# Patient Record
Sex: Female | Born: 1990 | Race: Black or African American | Hispanic: No | Marital: Single | Smoking: Never smoker
Health system: Southern US, Community
[De-identification: ages and names within clinical notes are randomized; demographics above are authoritative.]

## PROBLEM LIST (undated history)

## (undated) ENCOUNTER — Inpatient Hospital Stay (HOSPITAL_COMMUNITY): Payer: Self-pay

## (undated) DIAGNOSIS — F329 Major depressive disorder, single episode, unspecified: Secondary | ICD-10-CM

## (undated) DIAGNOSIS — Z9151 Personal history of suicidal behavior: Secondary | ICD-10-CM

## (undated) DIAGNOSIS — F32A Depression, unspecified: Secondary | ICD-10-CM

## (undated) DIAGNOSIS — R51 Headache: Secondary | ICD-10-CM

## (undated) DIAGNOSIS — Z915 Personal history of self-harm: Secondary | ICD-10-CM

## (undated) DIAGNOSIS — R519 Headache, unspecified: Secondary | ICD-10-CM

## (undated) HISTORY — PX: WISDOM TOOTH EXTRACTION: SHX21

---
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
## 2012-07-23 HISTORY — PX: LAPAROSCOPIC ABDOMINAL EXPLORATION: SHX6249

## 2014-06-22 ENCOUNTER — Inpatient Hospital Stay (HOSPITAL_COMMUNITY)
Admission: AD | Admit: 2014-06-22 | Discharge: 2014-06-22 | Disposition: A | Discharge status: Home / Self Care | Payer: Self-pay | Source: Ambulatory Visit | Attending: Obstetrics and Gynecology | Admitting: Obstetrics and Gynecology

## 2014-06-22 ENCOUNTER — Encounter (HOSPITAL_COMMUNITY): Payer: Self-pay | Admitting: *Deleted

## 2014-06-22 ENCOUNTER — Inpatient Hospital Stay (HOSPITAL_COMMUNITY): Payer: Self-pay

## 2014-06-22 DIAGNOSIS — N831 Corpus luteum cyst of ovary, unspecified side: Secondary | ICD-10-CM

## 2014-06-22 DIAGNOSIS — O219 Vomiting of pregnancy, unspecified: Secondary | ICD-10-CM

## 2014-06-22 DIAGNOSIS — O3441 Maternal care for other abnormalities of cervix, first trimester: Secondary | ICD-10-CM | POA: Insufficient documentation

## 2014-06-22 DIAGNOSIS — O210 Mild hyperemesis gravidarum: Secondary | ICD-10-CM | POA: Insufficient documentation

## 2014-06-22 DIAGNOSIS — O3481 Maternal care for other abnormalities of pelvic organs, first trimester: Secondary | ICD-10-CM | POA: Insufficient documentation

## 2014-06-22 DIAGNOSIS — Z3A01 Less than 8 weeks gestation of pregnancy: Secondary | ICD-10-CM | POA: Insufficient documentation

## 2014-06-22 DIAGNOSIS — N812 Incomplete uterovaginal prolapse: Secondary | ICD-10-CM | POA: Insufficient documentation

## 2014-06-22 DIAGNOSIS — R109 Unspecified abdominal pain: Secondary | ICD-10-CM | POA: Insufficient documentation

## 2014-06-22 DIAGNOSIS — O9989 Other specified diseases and conditions complicating pregnancy, childbirth and the puerperium: Secondary | ICD-10-CM

## 2014-06-22 DIAGNOSIS — O26899 Other specified pregnancy related conditions, unspecified trimester: Secondary | ICD-10-CM

## 2014-06-22 HISTORY — DX: Headache, unspecified: R51.9

## 2014-06-22 HISTORY — DX: Headache: R51

## 2014-06-22 LAB — URINALYSIS, ROUTINE W REFLEX MICROSCOPIC
BILIRUBIN URINE: NEGATIVE
Glucose, UA: NEGATIVE mg/dL
HGB URINE DIPSTICK: NEGATIVE
Ketones, ur: NEGATIVE mg/dL
Nitrite: NEGATIVE
PROTEIN: NEGATIVE mg/dL
Specific Gravity, Urine: 1.02 (ref 1.005–1.030)
UROBILINOGEN UA: 0.2 mg/dL (ref 0.0–1.0)
pH: 7 (ref 5.0–8.0)

## 2014-06-22 LAB — CBC
HCT: 36.4 % (ref 36.0–46.0)
Hemoglobin: 12.5 g/dL (ref 12.0–15.0)
MCH: 29.9 pg (ref 26.0–34.0)
MCHC: 34.3 g/dL (ref 30.0–36.0)
MCV: 87.1 fL (ref 78.0–100.0)
PLATELETS: 241 10*3/uL (ref 150–400)
RBC: 4.18 MIL/uL (ref 3.87–5.11)
RDW: 13 % (ref 11.5–15.5)
WBC: 4.6 10*3/uL (ref 4.0–10.5)

## 2014-06-22 LAB — HIV ANTIBODY (ROUTINE TESTING W REFLEX): HIV: NONREACTIVE

## 2014-06-22 LAB — POCT PREGNANCY, URINE: Preg Test, Ur: POSITIVE — AB

## 2014-06-22 LAB — ABO/RH: ABO/RH(D): B POS

## 2014-06-22 LAB — WET PREP, GENITAL
Clue Cells Wet Prep HPF POC: NONE SEEN
Trich, Wet Prep: NONE SEEN
YEAST WET PREP: NONE SEEN

## 2014-06-22 LAB — URINE MICROSCOPIC-ADD ON

## 2014-06-22 LAB — HCG, QUANTITATIVE, PREGNANCY: HCG, BETA CHAIN, QUANT, S: 51741 m[IU]/mL — AB (ref <5)

## 2014-06-22 MED ORDER — PROMETHAZINE HCL 25 MG PO TABS
25.0000 mg | ORAL_TABLET | Freq: Once | ORAL | Status: AC
  Administered 2014-06-22: 25 mg via ORAL
  Filled 2014-06-22: qty 1

## 2014-06-22 MED ORDER — PROMETHAZINE HCL 25 MG PO TABS: 25.0000 mg | ORAL_TABLET | Freq: Four times a day (QID) | ORAL | Status: DC | PRN

## 2014-06-22 MED ORDER — METOCLOPRAMIDE HCL 10 MG PO TABS: 20.0000 mg | ORAL_TABLET | Freq: Four times a day (QID) | ORAL | Status: DC | PRN

## 2014-06-22 NOTE — MAU Note (Signed)
Patient states she has had a positive home pregnancy test. 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 States she has nausea all the time with occasional vomiting. Has pain in the left lower abdomen. Denies bleeding or vaginal discharge.

## 2014-06-22 NOTE — MAU Note (Signed)
Lab unable to get labs and sending another tec over at this time.

## 2014-06-22 NOTE — MAU Note (Signed)
Pt. [place] in [place] about a fibroid about 1.2877yrs ago and has not followed up since. Here due to positive home pregnancy test. Also, states she has occasional nausea and vomiting. Fibroid is on the left side but has sharp pains bilaterally in the lower portion of her abdomen. LMP 05/01/14.

## 2014-06-22 NOTE — Discharge Instructions (Signed)
°  Abdominal Pain During Pregnancy Belly (abdominal) pain is common during pregnancy. Most of the time, it is not a serious problem. Other times, it can be a sign that something is wrong with the pregnancy. Always tell your doctor if you have belly pain. HOME CARE Monitor your belly pain for any changes. The following actions may help you feel better:  Do not have sex (intercourse) or put anything in your vagina until you feel better.  Rest until your pain stops.  Drink clear fluids if you feel sick to your stomach (nauseous). Do not eat solid food until you feel better.  Only take medicine as told by your doctor.  Keep all doctor visits as told. GET HELP RIGHT AWAY IF:   You are bleeding, leaking fluid, or pieces of tissue come out of your vagina.  You have more pain or cramping.  You keep throwing up (vomiting).  You have pain when you pee (urinate) or have blood in your pee.  You have a fever.  You do not feel your baby moving as much.  You feel very weak or feel like passing out.  You have trouble breathing, with or without belly pain.  You have a very bad headache and belly pain.  You have fluid leaking from your vagina and belly pain.  You keep having watery poop (diarrhea).  Your belly pain does not go away after resting, or the pain gets worse. MAKE SURE YOU:   Understand these instructions.  Will watch your condition.  Will get help right away if you are not doing well or get worse. Document Released: 06/27/2009 Document Revised: 03/11/2013 Document Reviewed: 02/05/2013 [facility] Patient Information 2015 [employer], [place]. This information is not intended to replace advice given to you by your health care provider. Make sure you discuss any questions you have with your health care provider.   Your cervix was very low in your vagina during todays exam. This does not require treatment unless you have problems with leaking urine or severe pelvic pressure.

## 2014-06-22 NOTE — MAU Provider Note (Signed)
Chief Complaint: Possible Pregnancy; Nausea; and Abdominal Pain   First Provider Initiated Contact with Patient 06/22/14 1802     SUBJECTIVE HPI: Kelly Holland is a 23 y.o. G1P0 at [redacted]w[redacted]d by LMP who presents with: 1. Positive home UPT 2. sharp, intermittent low abdominal pain, left greater than right 3. Constant nausea and occasional vomiting 1 week  Denies fever, chills, diarrhea, constipation, urinary complaints. Diagnosed with "egg-sized fibroid" on the left posterior uterus by gynecologist in [facility] last year by exploratory laparoscopy. States she has had pain and pressure, left lower quadrant because of the fibroid, but states the pain that she's been having in the past few days has been much sharper.  Past Medical History  Diagnosis Date  . Headache     migraines in the past   OB History  Gravida Para Term Preterm AB SAB TAB Ectopic Multiple Living  1             # Outcome Date GA Lbr Len/2nd Weight Sex Delivery Anes PTL Lv  1 Current              Past Surgical History  Procedure Laterality Date  . Wisdom tooth extraction    . Laparoscopic abdominal exploration  2014   History   Social History  . Marital Status: Single    Spouse Name: N/A    Number of Children: N/A  . Years of Education: N/A   Occupational History  . Not on file.   Social History Main Topics  . Smoking status: Not on file  . Smokeless tobacco: Not on file  . Alcohol Use: Not on file  . Drug Use: Not on file  . Sexual Activity: Not on file   Other Topics Concern  . Not on file   Social History Narrative  . No narrative on file   No current facility-administered medications on file prior to encounter.   No current outpatient prescriptions on file prior to encounter.   No Known Allergies  ROS: Pertinent positive items in HPI. Denies fever, chills, diarrhea, constipation, urinary complaints, vaginal bleeding, vaginal discharge.  OBJECTIVE Blood pressure 146/79, pulse 66,  temperature 98.6 F (37 C), temperature source Oral, resp. rate 16, height 5\' 8"  (1.727 m), weight 95.618 kg (210 lb 12.8 oz), last menstrual period 05/01/2014, SpO2 100 %. GENERAL: Well-developed, well-nourished female in no acute distress.  HEENT: Normocephalic. Mucous membranes moist. HEART: normal rate RESP: normal effort ABDOMEN: Soft, mild-moderate tenderness across the entire low abdomen. Positive voluntary guarding. Negative rebound tenderness. Negative mass. Negative CVA tenderness. Positive bowel sounds 4. EXTREMITIES: Nontender, no edema NEURO: Alert and oriented SPECULUM EXAM: NEFG except for 4 mm slightly erythematous, nontender, raised, non-vesicular mass below left labia majora and 2 mm smooth, raised, nontender, flush-colored mass consistent with skin tag lower right labia majora, physiologic discharge, no blood noted. Cervix clean, displaced to maternal left, very low in vagina.  BIMANUAL: cervix closed; unable to assess uterine size due to uterine position and patient intolerance of exam, no right adnexal tenderness or mass. Mild left adnexal tenderness. No mass. No cervical motion tenderness  LAB RESULTS Results for orders placed or performed during the hospital encounter of 06/22/14 (from the past 24 hour(s))  Urinalysis, Routine w reflex microscopic     Status: Abnormal   Collection Time: 06/22/14  3:55 PM  Result Value Ref Range   Color, Urine YELLOW YELLOW   APPearance CLEAR CLEAR   Specific Gravity, Urine 1.020 1.005 - 1.030  pH 7.0 5.0 - 8.0   Glucose, UA NEGATIVE NEGATIVE mg/dL   Hgb urine dipstick NEGATIVE NEGATIVE   Bilirubin Urine NEGATIVE NEGATIVE   Ketones, ur NEGATIVE NEGATIVE mg/dL   Protein, ur NEGATIVE NEGATIVE mg/dL   Urobilinogen, UA 0.2 0.0 - 1.0 mg/dL   Nitrite NEGATIVE NEGATIVE   Leukocytes, UA SMALL (A) NEGATIVE  Urine microscopic-add on     Status: Abnormal   Collection Time: 06/22/14  3:55 PM  Result Value Ref Range   Squamous Epithelial /  LPF RARE RARE   WBC, UA 7-10 <3 WBC/hpf   RBC / HPF 0-2 <3 RBC/hpf   Bacteria, UA FEW (A) RARE   Urine-Other MUCOUS PRESENT   Pregnancy, urine POC     Status: Abnormal   Collection Time: 06/22/14  4:19 PM  Result Value Ref Range   Preg Test, Ur POSITIVE (A) NEGATIVE  Wet prep, genital     Status: Abnormal   Collection Time: 06/22/14  6:11 PM  Result Value Ref Range   Yeast Wet Prep HPF POC NONE SEEN NONE SEEN   Trich, Wet Prep NONE SEEN NONE SEEN   Clue Cells Wet Prep HPF POC NONE SEEN NONE SEEN   WBC, Wet Prep HPF POC FEW (A) NONE SEEN  hCG, quantitative, pregnancy     Status: Abnormal   Collection Time: 06/22/14  6:47 PM  Result Value Ref Range   hCG, Beta Chain, Quant, S 51741 (H) <5 mIU/mL  ABO/Rh     Status: None   Collection Time: 06/22/14  6:47 PM  Result Value Ref Range   ABO/RH(D) B POS   CBC     Status: None   Collection Time: 06/22/14  6:47 PM  Result Value Ref Range   WBC 4.6 4.0 - 10.5 K/uL   RBC 4.18 3.87 - 5.11 MIL/uL   Hemoglobin 12.5 12.0 - 15.0 g/dL   HCT 16.136.4 09.636.0 - 04.546.0 %   MCV 87.1 78.0 - 100.0 fL   MCH 29.9 26.0 - 34.0 pg   MCHC 34.3 30.0 - 36.0 g/dL   RDW 40.913.0 81.111.5 - 91.415.5 %   Platelets 241 150 - 400 K/uL    IMAGING [place] Ob Comp Less 14 Wks  06/22/2014   CLINICAL DATA:  Pregnant patient with left pelvic pressure and pain.  EXAM: OBSTETRIC <14 WK US AND TRANSVAGINAL OB US  TECHNIQUE: Both transabdominal and transvaginal ultrasound examinations were performed for complete evaluation of the gestation as well as the maternal uterus, adnexal regions, and pelvic cul-de-sac. Transvaginal technique was performed to assess early pregnancy.  COMPARISON:  None.  FINDINGS: Intrauterine gestational sac: Visualized/normal in shape.  Yolk sac:  Visualized.  Embryo:  Visualized.  Cardiac Activity: Detected.  Heart Rate:  129 bpm  CRL:   0.89  mm   7 w 0 d                  US EDC: 02/08/2015.  Maternal uterus/adnexae: Likely corpus luteum cyst on the right noted. A small  amount of free pelvic fluid is seen.  IMPRESSION: Single living intrauterine pregnancy without complicating feature. No acute abnormality.   Electronically Signed   By: Drusilla Kannerhomas  Dalessio M.D.   On: 06/22/2014 19:51   [place] Ob Transvaginal  06/22/2014   CLINICAL DATA:  Pregnant patient with left pelvic pressure and pain.  EXAM: OBSTETRIC <14 WK US AND TRANSVAGINAL OB US  TECHNIQUE: Both transabdominal and transvaginal ultrasound examinations were performed for complete evaluation of the gestation  as well as the maternal uterus, adnexal regions, and pelvic cul-de-sac. Transvaginal technique was performed to assess early pregnancy.  COMPARISON:  None.  FINDINGS: Intrauterine gestational sac: Visualized/normal in shape.  Yolk sac:  Visualized.  Embryo:  Visualized.  Cardiac Activity: Detected.  Heart Rate:  129 bpm  CRL:   0.89  mm   7 w 0 d                  US EDC: 02/08/2015.  Maternal uterus/adnexae: Likely corpus luteum cyst on the right noted. A small amount of free pelvic fluid is seen.  IMPRESSION: Single living intrauterine pregnancy without complicating feature. No acute abnormality.   Electronically Signed   By: Drusilla Kannerhomas  Dalessio M.D.   On: 06/22/2014 19:51    MAU COURSE Phenergan given. Nausea improved. Patient able to eat and drink.  ASSESSMENT 1. Corpus luteum cyst   2. Abdominal pain affecting pregnancy, antepartum   3. Nausea and vomiting of pregnancy, antepartum   4. Cervix prolapsed into vagina   5.      Mass below left labia majora most likely a follicular cyst.   PLAN Discharge home in stable condition. GC/chlamydia cultures, herpes culture pending Lengthy discussion with patient about findings on exam and management of nausea and vomiting pregnancy. Pregnancy verification letter and list of providers given.     Follow-up Information    Please follow up.   Why:  Start prenatal care      [facility]   Why:  As needed in  emergencies   Contact information:   [address] [id number] [address] [place] [id number] [phone]       Medication List    TAKE these medications        metoCLOPramide 10 MG tablet  Commonly known as:  REGLAN  Take 2 tablets (20 mg total) by mouth every 6 (six) hours as needed for nausea or vomiting.     prenatal multivitamin Tabs tablet  Take 1 tablet by mouth daily at 12 noon.     promethazine 25 MG tablet  Commonly known as:  PHENERGAN  Take 1 tablet (25 mg total) by mouth every 6 (six) hours as needed for nausea or vomiting.       [place] Runell Kovich, CNM 06/22/2014  9:06 PM

## 2014-06-23 LAB — GC/CHLAMYDIA PROBE AMP
CT Probe RNA: NEGATIVE
GC PROBE AMP APTIMA: NEGATIVE

## 2014-06-24 LAB — HERPES SIMPLEX VIRUS CULTURE
Culture: NOT DETECTED
SPECIAL REQUESTS: NORMAL

## 2014-06-28 ENCOUNTER — Telehealth: Payer: Self-pay | Admitting: General Practice

## 2014-06-28 NOTE — Telephone Encounter (Signed)
-----   Message from Kelly Holland, [place] sent at 06/24/2014  9:54 PM EST ----- Please inform pt of neg HSV culture.

## 2014-06-28 NOTE — Telephone Encounter (Signed)
Called patient and informed her of results. Patient verbalized understanding and states that she's been calling other offices to get an appt but can't find anyone because they are either not accepting new patients or she can't get someone on the phone and she would like to know if she can just see [place] here. Told patient that we currently aren't accepting new patient's and recommended she continue to try to reach the office's she hasn't heard anything from yet. Patient verbalized understanding and had no other questions

## 2014-07-08 LAB — OB RESULTS CONSOLE ABO/RH: RH Type: POSITIVE

## 2014-07-08 LAB — OB RESULTS CONSOLE ANTIBODY SCREEN: ANTIBODY SCREEN: NEGATIVE

## 2014-07-08 LAB — OB RESULTS CONSOLE HEPATITIS B SURFACE ANTIGEN: Hepatitis B Surface Ag: NEGATIVE

## 2014-07-08 LAB — OB RESULTS CONSOLE RUBELLA ANTIBODY, IGM: Rubella: IMMUNE

## 2014-07-08 LAB — OB RESULTS CONSOLE RPR: RPR: NONREACTIVE

## 2014-07-09 ENCOUNTER — Other Ambulatory Visit (HOSPITAL_COMMUNITY): Payer: Self-pay | Admitting: Nurse Practitioner

## 2014-07-09 DIAGNOSIS — Z3A12 12 weeks gestation of pregnancy: Secondary | ICD-10-CM

## 2014-07-09 DIAGNOSIS — Z3682 Encounter for antenatal screening for nuchal translucency: Secondary | ICD-10-CM

## 2014-07-23 NOTE — L&D Delivery Note (Cosign Needed)
Delivery Note At 2:03 PM a viable female was delivered via Vaginal, Spontaneous Delivery (Presentation: Right Occiput Anterior).  APGAR: 8, 9; weight  .   Placenta status: Intact, Spontaneous.  Cord: 3 vessels with the following complications: None.  Cord pH: n/a  Anesthesia: Epidural  Episiotomy: None Lacerations: 2nd degree Suture Repair: 2.0 vicryl Est. Blood Loss (mL): 351  Mom to postpartum.  Baby to Couplet care / Skin to Skin.  Shivon Hackel DARLENE 01/30/2015, 2:22 PM

## 2014-07-28 ENCOUNTER — Ambulatory Visit (HOSPITAL_COMMUNITY)
Admission: RE | Admit: 2014-07-28 | Discharge: 2014-07-28 | Disposition: A | Discharge status: Home / Self Care | Payer: Self-pay | Source: Ambulatory Visit | Attending: Nurse Practitioner | Admitting: Nurse Practitioner

## 2014-07-28 ENCOUNTER — Encounter (HOSPITAL_COMMUNITY): Payer: Self-pay

## 2014-07-28 DIAGNOSIS — Z36 Encounter for antenatal screening of mother: Secondary | ICD-10-CM | POA: Insufficient documentation

## 2014-07-28 DIAGNOSIS — Z3682 Encounter for antenatal screening for nuchal translucency: Secondary | ICD-10-CM | POA: Insufficient documentation

## 2014-07-28 DIAGNOSIS — Z3A12 12 weeks gestation of pregnancy: Secondary | ICD-10-CM | POA: Insufficient documentation

## 2014-08-06 ENCOUNTER — Other Ambulatory Visit (HOSPITAL_COMMUNITY): Payer: Self-pay

## 2014-08-11 ENCOUNTER — Other Ambulatory Visit (HOSPITAL_COMMUNITY): Payer: Self-pay

## 2014-08-25 ENCOUNTER — Other Ambulatory Visit (HOSPITAL_COMMUNITY): Payer: Self-pay | Admitting: Urology

## 2014-08-25 DIAGNOSIS — Z3689 Encounter for other specified antenatal screening: Secondary | ICD-10-CM

## 2014-09-15 ENCOUNTER — Ambulatory Visit (HOSPITAL_COMMUNITY)
Admission: RE | Admit: 2014-09-15 | Discharge: 2014-09-15 | Disposition: A | Discharge status: Home / Self Care | Payer: Medicaid Other | Source: Ambulatory Visit | Attending: Urology | Admitting: Urology

## 2014-09-15 DIAGNOSIS — Z36 Encounter for antenatal screening of mother: Secondary | ICD-10-CM | POA: Insufficient documentation

## 2014-09-15 DIAGNOSIS — Z3689 Encounter for other specified antenatal screening: Secondary | ICD-10-CM | POA: Insufficient documentation

## 2014-09-15 DIAGNOSIS — Z3A19 19 weeks gestation of pregnancy: Secondary | ICD-10-CM | POA: Insufficient documentation

## 2014-09-20 ENCOUNTER — Other Ambulatory Visit (HOSPITAL_COMMUNITY): Payer: Self-pay | Admitting: Nurse Practitioner

## 2014-09-20 DIAGNOSIS — Z3689 Encounter for other specified antenatal screening: Secondary | ICD-10-CM

## 2014-09-23 LAB — OB RESULTS CONSOLE GC/CHLAMYDIA
CHLAMYDIA, DNA PROBE: NEGATIVE
GC PROBE AMP, GENITAL: NEGATIVE

## 2014-10-27 ENCOUNTER — Ambulatory Visit (HOSPITAL_COMMUNITY)
Admission: RE | Admit: 2014-10-27 | Discharge: 2014-10-27 | Disposition: A | Discharge status: Home / Self Care | Payer: Medicaid Other | Source: Ambulatory Visit | Attending: Nurse Practitioner | Admitting: Nurse Practitioner

## 2014-10-27 DIAGNOSIS — IMO0002 Reserved for concepts with insufficient information to code with codable children: Secondary | ICD-10-CM | POA: Insufficient documentation

## 2014-10-27 DIAGNOSIS — Z3A25 25 weeks gestation of pregnancy: Secondary | ICD-10-CM | POA: Insufficient documentation

## 2014-10-27 DIAGNOSIS — Z0489 Encounter for examination and observation for other specified reasons: Secondary | ICD-10-CM | POA: Insufficient documentation

## 2014-10-27 DIAGNOSIS — Z36 Encounter for antenatal screening of mother: Secondary | ICD-10-CM | POA: Insufficient documentation

## 2014-10-27 DIAGNOSIS — Z3689 Encounter for other specified antenatal screening: Secondary | ICD-10-CM

## 2014-10-27 DIAGNOSIS — O99212 Obesity complicating pregnancy, second trimester: Secondary | ICD-10-CM | POA: Insufficient documentation

## 2015-01-15 LAB — OB RESULTS CONSOLE GBS: STREP GROUP B AG: POSITIVE

## 2015-01-21 ENCOUNTER — Inpatient Hospital Stay (HOSPITAL_COMMUNITY)
Admission: AD | Admit: 2015-01-21 | Discharge: 2015-01-22 | Disposition: A | Discharge status: Home / Self Care | Payer: Medicaid Other | Source: Ambulatory Visit | Attending: Family Medicine | Admitting: Family Medicine

## 2015-01-21 ENCOUNTER — Encounter (HOSPITAL_COMMUNITY): Payer: Self-pay | Admitting: *Deleted

## 2015-01-21 DIAGNOSIS — R0602 Shortness of breath: Secondary | ICD-10-CM | POA: Insufficient documentation

## 2015-01-21 DIAGNOSIS — Z3A37 37 weeks gestation of pregnancy: Secondary | ICD-10-CM | POA: Diagnosis not present

## 2015-01-21 DIAGNOSIS — O9989 Other specified diseases and conditions complicating pregnancy, childbirth and the puerperium: Secondary | ICD-10-CM | POA: Insufficient documentation

## 2015-01-21 DIAGNOSIS — R0789 Other chest pain: Secondary | ICD-10-CM | POA: Insufficient documentation

## 2015-01-21 DIAGNOSIS — F411 Generalized anxiety disorder: Secondary | ICD-10-CM

## 2015-01-21 DIAGNOSIS — Z3A38 38 weeks gestation of pregnancy: Secondary | ICD-10-CM | POA: Diagnosis not present

## 2015-01-21 LAB — URINALYSIS, ROUTINE W REFLEX MICROSCOPIC
Bilirubin Urine: NEGATIVE
Glucose, UA: NEGATIVE mg/dL
HGB URINE DIPSTICK: NEGATIVE
Ketones, ur: NEGATIVE mg/dL
Nitrite: NEGATIVE
Protein, ur: NEGATIVE mg/dL
Specific Gravity, Urine: 1.02 (ref 1.005–1.030)
Urobilinogen, UA: 0.2 mg/dL (ref 0.0–1.0)
pH: 6.5 (ref 5.0–8.0)

## 2015-01-21 LAB — URINE MICROSCOPIC-ADD ON

## 2015-01-21 MED ORDER — ZOLPIDEM TARTRATE 5 MG PO TABS: 5.0000 mg | ORAL_TABLET | Freq: Every evening | ORAL | Status: DC | PRN

## 2015-01-21 NOTE — Discharge Instructions (Signed)
Third Trimester of Pregnancy The third trimester is from week 29 through week 42, months 7 through 9. The third trimester is a time when the fetus is growing rapidly. At the end of the ninth month, the fetus is about 20 inches in length and weighs 6-10 pounds.  BODY CHANGES Your body goes through many changes during pregnancy. The changes vary from woman to woman.   Your weight will continue to increase. You can expect to gain 25-35 pounds (11-16 kg) by the end of the pregnancy.  You may begin to get stretch marks on your hips, abdomen, and breasts.  You may urinate more often because the fetus is moving lower into your pelvis and pressing on your bladder.  You may develop or continue to have heartburn as a result of your pregnancy.  You may develop constipation because certain hormones are causing the muscles that push waste through your intestines to slow down.  You may develop hemorrhoids or swollen, bulging veins (varicose veins).  You may have pelvic pain because of the weight gain and pregnancy hormones relaxing your joints between the bones in your pelvis. Backaches may result from overexertion of the muscles supporting your posture.  You may have changes in your hair. These can include thickening of your hair, rapid growth, and changes in texture. Some women also have hair loss during or after pregnancy, or hair that feels dry or thin. Your hair will most likely return to normal after your baby is born.  Your breasts will continue to grow and be tender. A yellow discharge may leak from your breasts called colostrum.  Your belly button may stick out.  You may feel short of breath because of your expanding uterus.  You may notice the fetus "dropping," or moving lower in your abdomen.  You may have a bloody mucus discharge. This usually occurs a few days to a week before labor begins.  Your cervix becomes thin and soft (effaced) near your due date. WHAT TO EXPECT AT YOUR PRENATAL  EXAMS  You will have prenatal exams every 2 weeks until week 36. Then, you will have weekly prenatal exams. During a routine prenatal visit:  You will be weighed to make sure you and the fetus are growing normally.  Your blood pressure is taken.  Your abdomen will be measured to track your baby's growth.  The fetal heartbeat will be listened to.  Any test results from the previous visit will be discussed.  You may have a cervical check near your due date to see if you have effaced. At around 36 weeks, your caregiver will check your cervix. At the same time, your caregiver will also perform a test on the secretions of the vaginal tissue. This test is to determine if a type of bacteria, Group B streptococcus, is present. Your caregiver will explain this further. Your caregiver may ask you:  What your birth plan is.  How you are feeling.  If you are feeling the baby move.  If you have had any abnormal symptoms, such as leaking fluid, bleeding, severe headaches, or abdominal cramping.  If you have any questions. Other tests or screenings that may be performed during your third trimester include:  Blood tests that check for low iron levels (anemia).  Fetal testing to check the health, activity level, and growth of the fetus. Testing is done if you have certain medical conditions or if there are problems during the pregnancy. FALSE LABOR You may feel small, irregular contractions that   eventually go away. These are called Braxton Hicks contractions, or false labor. Contractions may last for hours, days, or even weeks before true labor sets in. If contractions come at regular intervals, intensify, or become painful, it is best to be seen by your caregiver.  SIGNS OF LABOR   Menstrual-like cramps.  Contractions that are 5 minutes apart or less.  Contractions that start on the top of the uterus and spread down to the lower abdomen and back.  A sense of increased pelvic pressure or back  pain.  A watery or bloody mucus discharge that comes from the vagina. If you have any of these signs before the 37th week of pregnancy, call your caregiver right away. You need to go to the hospital to get checked immediately. HOME CARE INSTRUCTIONS   Avoid all smoking, herbs, alcohol, and unprescribed drugs. These chemicals affect the formation and growth of the baby.  Follow your caregiver's instructions regarding medicine use. There are medicines that are either safe or unsafe to take during pregnancy.  Exercise only as directed by your caregiver. Experiencing uterine cramps is a good sign to stop exercising.  Continue to eat regular, healthy meals.  Wear a good support bra for breast tenderness.  Do not use hot tubs, steam rooms, or saunas.  Wear your seat belt at all times when driving.  Avoid raw meat, uncooked cheese, cat litter boxes, and soil used by cats. These carry germs that can cause birth defects in the baby.  Take your prenatal vitamins.  Try taking a stool softener (if your caregiver approves) if you develop constipation. Eat more high-fiber foods, such as fresh vegetables or fruit and whole grains. Drink plenty of fluids to keep your urine clear or pale yellow.  Take warm sitz baths to soothe any pain or discomfort caused by hemorrhoids. Use hemorrhoid cream if your caregiver approves.  If you develop varicose veins, wear support hose. Elevate your feet for 15 minutes, 3-4 times a day. Limit salt in your diet.  Avoid heavy lifting, wear low heal shoes, and practice good posture.  Rest a lot with your legs elevated if you have leg cramps or low back pain.  Visit your dentist if you have not gone during your pregnancy. Use a soft toothbrush to brush your teeth and be gentle when you floss.  A sexual relationship may be continued unless your caregiver directs you otherwise.  Do not travel far distances unless it is absolutely necessary and only with the approval  of your caregiver.  Take prenatal classes to understand, practice, and ask questions about the labor and delivery.  Make a trial run to the hospital.  Pack your hospital bag.  Prepare the baby's nursery.  Continue to go to all your prenatal visits as directed by your caregiver. SEEK MEDICAL CARE IF:  You are unsure if you are in labor or if your water has broken.  You have dizziness.  You have mild pelvic cramps, pelvic pressure, or nagging pain in your abdominal area.  You have persistent nausea, vomiting, or diarrhea.  You have a bad smelling vaginal discharge.  You have pain with urination. SEEK IMMEDIATE MEDICAL CARE IF:   You have a fever.  You are leaking fluid from your vagina.  You have spotting or bleeding from your vagina.  You have severe abdominal cramping or pain.  You have rapid weight loss or gain.  You have shortness of breath with chest pain.  You notice sudden or extreme swelling   of your face, hands, ankles, feet, or legs.  You have not felt your baby move in over an hour.  You have severe headaches that do not go away with medicine.  You have vision changes. Document Released: 07/03/2001 Document Revised: 07/14/2013 Document Reviewed: 09/09/2012 ExitCare Patient Information 2015 [employer]. This information is not intended to replace advice given to you by your health care provider. Make sure you discuss any questions you have with your health care provider.  

## 2015-01-21 NOTE — MAU Provider Note (Signed)
History     CSN: [id number]  Arrival date and time: 01/21/15 2201   None     Chief Complaint  Patient presents with  . Shortness of Breath  . Palpitations  . Back Pain   HPI  OB History    Gravida Para Term Preterm AB TAB SAB Ectopic Multiple Living   1                Patient is 24 y.o. G1P0 5075w6d here with complaints of SOB.  - Earlier this evening (5 or 6pm), felt like she "couldn't get enough oxygen." - when pushed to describe further describes it as not able to take a deep breath - also assoc w Palpitations, chest heavy, trouble keeping balance when standing - Feels weak all over - Constant HA that pre-existed - Used to have episodes during 1st and 2nd trimesters of palpiations and pre-syncope - reports anxiety for many years - was previously on multiple different meds that she stopped when she found out she was pregnant and was prescribed Wellbutrin recently but only took a few doses - Denies fevers, dysuria, hematuria, calf pain - also reports that she is unableto sleep well - taking PO well  +FM, denies LOF, VB, contractions, vaginal discharge.    Past Medical History  Diagnosis Date  . Headache     migraines in the past    Past Surgical History  Procedure Laterality Date  . Wisdom tooth extraction    . Laparoscopic abdominal exploration  2014    Family History  Problem Relation Age of Onset  . Heart disease Mother   . Hypertension Mother   . Hypertension Father   . Kidney disease Father     History  Substance Use Topics  . Smoking status: Never Smoker   . Smokeless tobacco: Never Used  . Alcohol Use: No    Allergies: No Known Allergies  Prescriptions prior to admission  Medication Sig Dispense Refill Last Dose  . Prenatal Vit-Fe Fumarate-FA (PRENATAL MULTIVITAMIN) TABS tablet Take 1 tablet by mouth daily at 12 noon.   01/20/2015 at Unknown time  . metoCLOPramide (REGLAN) 10 MG tablet Take 2 tablets (20 mg total) by mouth every 6 (six)  hours as needed for nausea or vomiting. 90 tablet 2 More than a month at Unknown time  . promethazine (PHENERGAN) 25 MG tablet Take 1 tablet (25 mg total) by mouth every 6 (six) hours as needed for nausea or vomiting. 30 tablet 1 More than a month at Unknown time   Medical, Surgical, Family and Social histories reviewed and are listed above.  Medications and allergies reviewed.   Review of Systems  Constitutional: Negative for fever and chills.  Eyes: Negative for blurred vision and photophobia.  Respiratory: Positive for shortness of breath. Negative for cough, hemoptysis, sputum production and wheezing.   Cardiovascular: Positive for palpitations and leg swelling. Negative for chest pain, orthopnea and claudication.  Gastrointestinal: Negative for nausea, vomiting and abdominal pain.  Genitourinary: Negative for dysuria, urgency, frequency and hematuria.  Musculoskeletal: Negative for myalgias and falls.  Skin: Negative for rash.  Neurological: Positive for dizziness and headaches. Negative for focal weakness.  Psychiatric/Behavioral: The patient is nervous/anxious.    Physical Exam   Blood pressure 136/77, pulse 79, temperature 98.6 F (37 C), resp. rate 18, height 5' 7.5" (1.715 m), weight 249 lb 3.2 oz (113.036 kg), last menstrual period 05/01/2014, SpO2 100 %.  Physical Exam  Constitutional: She is oriented to person, place, and  time. She appears well-developed and well-nourished. No distress.  HENT:  Head: Normocephalic and atraumatic.  Eyes: EOM are normal. No scleral icterus.  Neck: Normal range of motion.  Cardiovascular: Normal rate, regular rhythm, normal heart sounds and intact distal pulses.   No murmur heard. Respiratory: Effort normal and breath sounds normal. No respiratory distress. She has no wheezes.  GI: There is no tenderness. There is no rebound and no guarding.  gravid  Neurological: She is alert and oriented to person, place, and time. She has normal  reflexes. No cranial nerve deficit.  Skin: Skin is warm and dry.  Psychiatric: She has a normal mood and affect. Her behavior is normal.    MAU Course  Procedures  MDM FHR - Cat1, baseline 150, accels present, no decels No UCs on toco  Urinalysis    Component Value Date/Time   COLORURINE YELLOW 01/21/2015 2220   APPEARANCEUR CLEAR 01/21/2015 2220   LABSPEC 1.020 01/21/2015 2220   PHURINE 6.5 01/21/2015 2220   GLUCOSEU NEGATIVE 01/21/2015 2220   HGBUR NEGATIVE 01/21/2015 2220   BILIRUBINUR NEGATIVE 01/21/2015 2220   KETONESUR NEGATIVE 01/21/2015 2220   PROTEINUR NEGATIVE 01/21/2015 2220   UROBILINOGEN 0.2 01/21/2015 2220   NITRITE NEGATIVE 01/21/2015 2220   LEUKOCYTESUR SMALL* 01/21/2015 2220    Assessment and Plan  Patient is 24 y.o. G1P0 [redacted]w[redacted]d reporting SOB, chest pressure likely secondary to anxiety and fetus pushing up on diaphragm. - fetal kick counts reinforced - preterm labor precautions - reassured patient - no UTI symptoms - suspect dirty catch - will send for culture - offered Zoloft for depression, anxiety, but pt declined - Rx for ambien  qhs prn x4 tabs given as patient unable to sleep  Erasmo Downer, MD, MPH PGY-2,  [facility] 01/21/2015 11:43 PM  Patient seen and examined by me also Agree with note  Lungs clear bilaterally with no wheezing O2 sats excellent Does not appear short of breath Does admit to anxiety but does not want to take prescribed meds Ambien (small dose) Rx given for sleep.  Tonight she may use Benadryl until gets Rx Aviva Signs, CNM

## 2015-01-21 NOTE — MAU Note (Signed)
Earlier in evening was feeling weak, like could not get enough air, chest heavy, trouble keeping balance, headache since 1600. Had labs this past wk for elevated B/P and waiting for results.

## 2015-01-22 DIAGNOSIS — O9989 Other specified diseases and conditions complicating pregnancy, childbirth and the puerperium: Secondary | ICD-10-CM | POA: Diagnosis not present

## 2015-01-22 DIAGNOSIS — R0602 Shortness of breath: Secondary | ICD-10-CM

## 2015-01-22 DIAGNOSIS — Z3A38 38 weeks gestation of pregnancy: Secondary | ICD-10-CM

## 2015-01-22 DIAGNOSIS — F411 Generalized anxiety disorder: Secondary | ICD-10-CM | POA: Diagnosis not present

## 2015-01-24 LAB — CULTURE, OB URINE: SPECIAL REQUESTS: NORMAL

## 2015-01-25 ENCOUNTER — Inpatient Hospital Stay (HOSPITAL_COMMUNITY)
Admission: AD | Admit: 2015-01-25 | Discharge: 2015-01-25 | Disposition: A | Discharge status: Home / Self Care | Payer: Medicaid Other | Source: Ambulatory Visit | Attending: Family Medicine | Admitting: Family Medicine

## 2015-01-25 DIAGNOSIS — R030 Elevated blood-pressure reading, without diagnosis of hypertension: Secondary | ICD-10-CM | POA: Diagnosis present

## 2015-01-25 DIAGNOSIS — O133 Gestational [pregnancy-induced] hypertension without significant proteinuria, third trimester: Secondary | ICD-10-CM | POA: Diagnosis not present

## 2015-01-25 DIAGNOSIS — Z3A38 38 weeks gestation of pregnancy: Secondary | ICD-10-CM | POA: Insufficient documentation

## 2015-01-25 LAB — CBC
HCT: 35.1 % — ABNORMAL LOW (ref 36.0–46.0)
Hemoglobin: 11.7 g/dL — ABNORMAL LOW (ref 12.0–15.0)
MCH: 29 pg (ref 26.0–34.0)
MCHC: 33.3 g/dL (ref 30.0–36.0)
MCV: 87.1 fL (ref 78.0–100.0)
PLATELETS: 191 10*3/uL (ref 150–400)
RBC: 4.03 MIL/uL (ref 3.87–5.11)
RDW: 13.7 % (ref 11.5–15.5)
WBC: 5.2 10*3/uL (ref 4.0–10.5)

## 2015-01-25 LAB — COMPREHENSIVE METABOLIC PANEL
ALBUMIN: 3 g/dL — AB (ref 3.5–5.0)
ALK PHOS: 96 U/L (ref 38–126)
ALT: 13 U/L — AB (ref 14–54)
AST: 16 U/L (ref 15–41)
Anion gap: 5 (ref 5–15)
BILIRUBIN TOTAL: 0.3 mg/dL (ref 0.3–1.2)
BUN: 10 mg/dL (ref 6–20)
CHLORIDE: 108 mmol/L (ref 101–111)
CO2: 20 mmol/L — ABNORMAL LOW (ref 22–32)
Calcium: 8.7 mg/dL — ABNORMAL LOW (ref 8.9–10.3)
Creatinine, Ser: 0.69 mg/dL (ref 0.44–1.00)
GFR calc Af Amer: >60 mL/min (ref >60)
GFR calc non Af Amer: >60 mL/min (ref >60)
Glucose, Bld: 116 mg/dL — ABNORMAL HIGH (ref 65–99)
POTASSIUM: 3.7 mmol/L (ref 3.5–5.1)
SODIUM: 133 mmol/L — AB (ref 135–145)
TOTAL PROTEIN: 6.6 g/dL (ref 6.5–8.1)

## 2015-01-25 LAB — URINALYSIS, ROUTINE W REFLEX MICROSCOPIC
Bilirubin Urine: NEGATIVE
Glucose, UA: NEGATIVE mg/dL
Hgb urine dipstick: NEGATIVE
KETONES UR: NEGATIVE mg/dL
NITRITE: NEGATIVE
Protein, ur: NEGATIVE mg/dL
Urobilinogen, UA: 0.2 mg/dL (ref 0.0–1.0)
pH: 6 (ref 5.0–8.0)

## 2015-01-25 LAB — URINE MICROSCOPIC-ADD ON

## 2015-01-25 LAB — PROTEIN / CREATININE RATIO, URINE
Creatinine, Urine: 177 mg/dL
Protein Creatinine Ratio: 0.11 mg/mg{Cre} (ref 0.00–0.15)
Total Protein, Urine: 20 mg/dL

## 2015-01-25 NOTE — MAU Provider Note (Signed)
Chief Complaint:  Hypertension   First Provider Initiated Contact with Patient 01/25/15 1857     HPI: Kelly Holland is a 24 y.o. G1P0 at [redacted]w[redacted]d who was sent to maternity admissions from [facility] dept for evaluation of elevated BP in the office this afternoon. No Hx HTN. Denies HA, vision changes, epigastric pain, contractions, leakage of fluid or vaginal bleeding. Good fetal movement.   Past Medical History: Past Medical History  Diagnosis Date  . Headache     migraines in the past    Past obstetric history: OB History  Gravida Para Term Preterm AB SAB TAB Ectopic Multiple Living  1             # Outcome Date GA Lbr Len/2nd Weight Sex Delivery Anes PTL Lv  1 Current               Past Surgical History: Past Surgical History  Procedure Laterality Date  . Wisdom tooth extraction    . Laparoscopic abdominal exploration  2014     Family History: Family History  Problem Relation Age of Onset  . Heart disease Mother   . Hypertension Mother   . Hypertension Father   . Kidney disease Father     Social History: History  Substance Use Topics  . Smoking status: Never Smoker   . Smokeless tobacco: Never Used  . Alcohol Use: No    Allergies: No Known Allergies  Meds:  Prescriptions prior to admission  Medication Sig Dispense Refill Last Dose  . acetaminophen (TYLENOL) 500 MG tablet Take 500 mg by mouth every 6 (six) hours as needed for headache.   Past Month at Unknown time  . Ca Carbonate-Mag Hydroxide (ROLAIDS PO) Take 2 tablets by mouth as needed (For heartburn.). Patient takes 2 up to five times daily prn.   01/24/2015 at Unknown time  . cyclobenzaprine (FLEXERIL) 5 MG tablet Take 5 mg by mouth 3 (three) times daily as needed (For headache.).   Past Week at Unknown time  . Prenatal Vit-Fe Fumarate-FA (PRENATAL MULTIVITAMIN) TABS tablet Take 1 tablet by mouth daily at 12 noon.   01/25/2015 at Unknown time  . zolpidem (AMBIEN) 5 MG tablet Take 1 tablet (5 mg  total) by mouth at bedtime as needed for sleep. (Patient not taking: Reported on 01/25/2015) 4 tablet 0 Not Taking at Unknown time    ROS:  Review of Systems  Constitutional: Negative for fever and chills.  Eyes: Negative for blurred vision.  Gastrointestinal: Negative for abdominal pain.  Genitourinary:       Neg for LOF or VB.   Neurological: Negative for headaches.    Physical Exam  Blood pressure 135/79, pulse 85, temperature 98.3 F (36.8 C), temperature source Oral, resp. rate 18, last menstrual period 05/01/2014, SpO2 100 %.  Patient Vitals for the past 24 hrs:  BP Temp Temp src Pulse Resp SpO2  01/25/15 1816 135/79 mmHg - - 85 - -  01/25/15 1801 136/76 mmHg - - 80 - -  01/25/15 1746 137/73 mmHg - - 85 - -  01/25/15 1716 139/74 mmHg - - 79 - -  01/25/15 1701 136/72 mmHg - - 82 - -  01/25/15 1645 153/81 mmHg - - 92 18 -  01/25/15 1625 142/74 mmHg 98.3 F (36.8 C) Oral 90 20 100 %    GENERAL: Well-developed, well-nourished female in no acute distress.  HEART: normal rate RESP: normal effort GI: Abd soft, non-tender, gravid appropriate for gestational age.  MS:  Extremities nontender, Tr edema, normal ROM NEURO: Alert and oriented x 4.  SPECULUM EXAM: Deferred  Dilation: Fingertip Effacement (%): 50 Cervical Position: Posterior Exam by:: Rebbeca Paul RN  FHT:  Baseline 140 , moderate variability, accelerations present, no decelerations Contractions: None   Labs: Results for orders placed or performed during the hospital encounter of 01/25/15 (from the past 24 hour(s))  Urinalysis, Routine w reflex microscopic (not at [facility])     Status: Abnormal   Collection Time: 01/25/15  4:28 PM  Result Value Ref Range   Color, Urine YELLOW YELLOW   APPearance CLEAR CLEAR   Specific Gravity, Urine >1.030 (H) 1.005 - 1.030   pH 6.0 5.0 - 8.0   Glucose, UA NEGATIVE NEGATIVE mg/dL   Hgb urine dipstick NEGATIVE NEGATIVE   Bilirubin Urine NEGATIVE NEGATIVE   Ketones, ur NEGATIVE  NEGATIVE mg/dL   Protein, ur NEGATIVE NEGATIVE mg/dL   Urobilinogen, UA 0.2 0.0 - 1.0 mg/dL   Nitrite NEGATIVE NEGATIVE   Leukocytes, UA TRACE (A) NEGATIVE  Protein / creatinine ratio, urine     Status: None   Collection Time: 01/25/15  4:28 PM  Result Value Ref Range   Creatinine, Urine 177.00 mg/dL   Total Protein, Urine 20 mg/dL   Protein Creatinine Ratio 0.11 0.00 - 0.15 mg/mg[Cre]  Urine microscopic-add on     Status: Abnormal   Collection Time: 01/25/15  4:28 PM  Result Value Ref Range   Squamous Epithelial / LPF FEW (A) RARE   WBC, UA 3-6 <3 WBC/hpf   RBC / HPF 0-2 <3 RBC/hpf   Bacteria, UA MANY (A) RARE   Urine-Other MUCOUS PRESENT   CBC     Status: Abnormal   Collection Time: 01/25/15  5:13 PM  Result Value Ref Range   WBC 5.2 4.0 - 10.5 K/uL   RBC 4.03 3.87 - 5.11 MIL/uL   Hemoglobin 11.7 (L) 12.0 - 15.0 g/dL   HCT 60.4 (L) 54.0 - 98.1 %   MCV 87.1 78.0 - 100.0 fL   MCH 29.0 26.0 - 34.0 pg   MCHC 33.3 30.0 - 36.0 g/dL   RDW 19.1 47.8 - 29.5 %   Platelets 191 150 - 400 K/uL  Comprehensive metabolic panel     Status: Abnormal   Collection Time: 01/25/15  5:13 PM  Result Value Ref Range   Sodium 133 (L) 135 - 145 mmol/L   Potassium 3.7 3.5 - 5.1 mmol/L   Chloride 108 101 - 111 mmol/L   CO2 20 (L) 22 - 32 mmol/L   Glucose, Bld 116 (H) 65 - 99 mg/dL   BUN 10 6 - 20 mg/dL   Creatinine, Ser 6.21 0.44 - 1.00 mg/dL   Calcium 8.7 (L) 8.9 - 10.3 mg/dL   Total Protein 6.6 6.5 - 8.1 g/dL   Albumin 3.0 (L) 3.5 - 5.0 g/dL   AST 16 15 - 41 U/L   ALT 13 (L) 14 - 54 U/L   Alkaline Phosphatase 96 38 - 126 U/L   Total Bilirubin 0.3 0.3 - 1.2 mg/dL   GFR calc non Af Amer >60 >60 mL/min   GFR calc Af Amer >60 >60 mL/min   Anion gap 5 5 - 15    Imaging:  No results found.  MAU Course: CBC, CMET, UA, P:C, NST.  Normal labs, two mildly elevated BP's. W/ new-onset HTN this afternon, no evidence of Pre-E and unripe cervix will D/C home and induce at 39 weeks per consult w/  Dr.  Stinson. IO scheduled. 01/29/15.    Assessment: 1. Gestational hypertension without significant proteinuria during pregnancy in third trimester, antepartum     Plan: Discharge home in stable condition.  Labor precautions, Pre-E precautions and fetal kick counts Follow-up Information    [facility] On 01/29/2015.   Why:  at 11:45 pm for induction of labor   Contact information:   [address] [id number] mc [place] [id number] [phone]      [facility]   Why:  As needed in emergencies   Contact information:   [address] [id number] mc [place] [phone]        Medication List    ASK your doctor about these medications        acetaminophen 500 MG tablet  Commonly known as:  TYLENOL  Take 500 mg by mouth every 6 (six) hours as needed for headache.     cyclobenzaprine 5 MG tablet  Commonly known as:  FLEXERIL  Take 5 mg by mouth 3 (three) times daily as needed (For headache.).     prenatal multivitamin Tabs tablet  Take 1 tablet by mouth daily at 12 noon.     ROLAIDS PO  Take 2 tablets by mouth as needed (For heartburn.). Patient takes 2 up to five times daily prn.     zolpidem 5 MG tablet  Commonly known as:  AMBIEN  Take 1 tablet (5 mg total) by mouth at bedtime as needed for sleep.        Hoyt[place]ia Tangia Pinard, [place] 01/25/2015 7:43 PM

## 2015-01-25 NOTE — Discharge Instructions (Signed)

## 2015-01-25 NOTE — MAU Note (Signed)
Up to bathroom

## 2015-01-25 NOTE — MAU Note (Signed)
[facility], BP was 140/87.  Pt has slight HA, denies blurred vision or epigastric pain.  Pt states she feels very weak.  Denies uc's, bleeding or LOF.

## 2015-01-25 NOTE — MAU Note (Signed)
Phoned Dr. Earlene PlaterWallace spoke with Weston BrassNick (Resident) [facility]

## 2015-01-26 ENCOUNTER — Telehealth (HOSPITAL_COMMUNITY): Payer: Self-pay | Admitting: *Deleted

## 2015-01-26 NOTE — Telephone Encounter (Signed)
Preadmission screen  

## 2015-01-29 ENCOUNTER — Encounter (HOSPITAL_COMMUNITY): Payer: Medicaid Other

## 2015-01-30 ENCOUNTER — Inpatient Hospital Stay (HOSPITAL_COMMUNITY)
Admission: RE | Admit: 2015-01-30 | Discharge: 2015-02-01 | DRG: 775 | Disposition: A | Discharge status: Home / Self Care | Payer: Medicaid Other | Source: Ambulatory Visit | Attending: Obstetrics and Gynecology | Admitting: Obstetrics and Gynecology

## 2015-01-30 ENCOUNTER — Encounter (HOSPITAL_COMMUNITY): Payer: Self-pay

## 2015-01-30 ENCOUNTER — Inpatient Hospital Stay (HOSPITAL_COMMUNITY): Payer: Medicaid Other | Admitting: Anesthesiology

## 2015-01-30 DIAGNOSIS — O99214 Obesity complicating childbirth: Secondary | ICD-10-CM | POA: Diagnosis present

## 2015-01-30 DIAGNOSIS — Z8249 Family history of ischemic heart disease and other diseases of the circulatory system: Secondary | ICD-10-CM | POA: Diagnosis not present

## 2015-01-30 DIAGNOSIS — Z3A39 39 weeks gestation of pregnancy: Secondary | ICD-10-CM | POA: Diagnosis present

## 2015-01-30 DIAGNOSIS — O99824 Streptococcus B carrier state complicating childbirth: Secondary | ICD-10-CM | POA: Diagnosis present

## 2015-01-30 DIAGNOSIS — K219 Gastro-esophageal reflux disease without esophagitis: Secondary | ICD-10-CM | POA: Diagnosis present

## 2015-01-30 DIAGNOSIS — F1290 Cannabis use, unspecified, uncomplicated: Secondary | ICD-10-CM | POA: Diagnosis present

## 2015-01-30 DIAGNOSIS — O9962 Diseases of the digestive system complicating childbirth: Secondary | ICD-10-CM | POA: Diagnosis present

## 2015-01-30 DIAGNOSIS — O133 Gestational [pregnancy-induced] hypertension without significant proteinuria, third trimester: Secondary | ICD-10-CM | POA: Diagnosis present

## 2015-01-30 DIAGNOSIS — O99324 Drug use complicating childbirth: Secondary | ICD-10-CM | POA: Diagnosis present

## 2015-01-30 DIAGNOSIS — Z6838 Body mass index (BMI) 38.0-38.9, adult: Secondary | ICD-10-CM

## 2015-01-30 DIAGNOSIS — O139 Gestational [pregnancy-induced] hypertension without significant proteinuria, unspecified trimester: Secondary | ICD-10-CM | POA: Diagnosis present

## 2015-01-30 HISTORY — DX: Personal history of suicidal behavior: Z91.51

## 2015-01-30 HISTORY — DX: Major depressive disorder, single episode, unspecified: F32.9

## 2015-01-30 HISTORY — DX: Personal history of self-harm: Z91.5

## 2015-01-30 HISTORY — DX: Depression, unspecified: F32.A

## 2015-01-30 LAB — COMPREHENSIVE METABOLIC PANEL
ALT: 15 U/L (ref 14–54)
AST: 20 U/L (ref 15–41)
Albumin: 3.3 g/dL — ABNORMAL LOW (ref 3.5–5.0)
Alkaline Phosphatase: 105 U/L (ref 38–126)
Anion gap: 4 — ABNORMAL LOW (ref 5–15)
BILIRUBIN TOTAL: 0.2 mg/dL — AB (ref 0.3–1.2)
BUN: 12 mg/dL (ref 6–20)
CHLORIDE: 109 mmol/L (ref 101–111)
CO2: 20 mmol/L — ABNORMAL LOW (ref 22–32)
Calcium: 8.7 mg/dL — ABNORMAL LOW (ref 8.9–10.3)
Creatinine, Ser: 0.73 mg/dL (ref 0.44–1.00)
GFR calc non Af Amer: >60 mL/min (ref >60)
Glucose, Bld: 104 mg/dL — ABNORMAL HIGH (ref 65–99)
POTASSIUM: 3.9 mmol/L (ref 3.5–5.1)
SODIUM: 133 mmol/L — AB (ref 135–145)
Total Protein: 6.5 g/dL (ref 6.5–8.1)

## 2015-01-30 LAB — CBC
HCT: 35.6 % — ABNORMAL LOW (ref 36.0–46.0)
HEMATOCRIT: 35.5 % — AB (ref 36.0–46.0)
HEMATOCRIT: 34.7 % — AB (ref 36.0–46.0)
HEMOGLOBIN: 11.7 g/dL — AB (ref 12.0–15.0)
Hemoglobin: 11.9 g/dL — ABNORMAL LOW (ref 12.0–15.0)
Hemoglobin: 12.1 g/dL (ref 12.0–15.0)
MCH: 29.2 pg (ref 26.0–34.0)
MCH: 29.4 pg (ref 26.0–34.0)
MCH: 29.3 pg (ref 26.0–34.0)
MCHC: 33.5 g/dL (ref 30.0–36.0)
MCHC: 34 g/dL (ref 30.0–36.0)
MCHC: 33.7 g/dL (ref 30.0–36.0)
MCV: 87 fL (ref 78.0–100.0)
MCV: 86.4 fL (ref 78.0–100.0)
MCV: 87 fL (ref 78.0–100.0)
PLATELETS: 185 10*3/uL (ref 150–400)
Platelets: 187 10*3/uL (ref 150–400)
Platelets: 184 10*3/uL (ref 150–400)
RBC: 4.08 MIL/uL (ref 3.87–5.11)
RBC: 4.12 MIL/uL (ref 3.87–5.11)
RBC: 3.99 MIL/uL (ref 3.87–5.11)
RDW: 13.8 % (ref 11.5–15.5)
RDW: 13.6 % (ref 11.5–15.5)
RDW: 13.6 % (ref 11.5–15.5)
WBC: 6.5 10*3/uL (ref 4.0–10.5)
WBC: 6.9 10*3/uL (ref 4.0–10.5)
WBC: 6.9 10*3/uL (ref 4.0–10.5)

## 2015-01-30 LAB — PROTEIN / CREATININE RATIO, URINE
Creatinine, Urine: 203 mg/dL
Protein Creatinine Ratio: 0.14 mg/mg{Cre} (ref 0.00–0.15)
TOTAL PROTEIN, URINE: 28 mg/dL

## 2015-01-30 LAB — TYPE AND SCREEN
ABO/RH(D): B POS
Antibody Screen: NEGATIVE

## 2015-01-30 LAB — RAPID URINE DRUG SCREEN, HOSP PERFORMED
AMPHETAMINES: NOT DETECTED
Barbiturates: NOT DETECTED
Benzodiazepines: NOT DETECTED
COCAINE: NOT DETECTED
Opiates: NOT DETECTED
Tetrahydrocannabinol: NOT DETECTED

## 2015-01-30 LAB — HIV ANTIBODY (ROUTINE TESTING W REFLEX): HIV Screen 4th Generation wRfx: NONREACTIVE

## 2015-01-30 LAB — RPR: RPR Ser Ql: NONREACTIVE

## 2015-01-30 MED ORDER — PROMETHAZINE HCL 25 MG/ML IJ SOLN
25.0000 mg | Freq: Once | INTRAMUSCULAR | Status: AC
  Administered 2015-01-30: 25 mg via INTRAMUSCULAR
  Filled 2015-01-30: qty 1

## 2015-01-30 MED ORDER — PHENYLEPHRINE 40 MCG/ML (10ML) SYRINGE FOR IV PUSH (FOR BLOOD PRESSURE SUPPORT)
80.0000 ug | PREFILLED_SYRINGE | INTRAVENOUS | Status: DC | PRN
  Filled 2015-01-30: qty 2
  Filled 2015-01-30: qty 20

## 2015-01-30 MED ORDER — TERBUTALINE SULFATE 1 MG/ML IJ SOLN
0.2500 mg | Freq: Once | INTRAMUSCULAR | Status: DC | PRN
Start: 2015-01-30 — End: 2015-01-30
  Filled 2015-01-30: qty 1

## 2015-01-30 MED ORDER — PENICILLIN G POTASSIUM 5000000 UNITS IJ SOLR
5.0000 10*6.[IU] | Freq: Once | INTRAMUSCULAR | Status: AC
  Administered 2015-01-30: 5 10*6.[IU] via INTRAVENOUS
  Filled 2015-01-30: qty 5

## 2015-01-30 MED ORDER — EPHEDRINE 5 MG/ML INJ
10.0000 mg | INTRAVENOUS | Status: DC | PRN
  Filled 2015-01-30: qty 2

## 2015-01-30 MED ORDER — ONDANSETRON HCL 4 MG/2ML IJ SOLN: 4.0000 mg | INTRAMUSCULAR | Status: DC | PRN

## 2015-01-30 MED ORDER — OXYTOCIN 40 UNITS IN LACTATED RINGERS INFUSION - SIMPLE MED: 62.5000 mL/h | INTRAVENOUS | Status: DC | PRN

## 2015-01-30 MED ORDER — OXYCODONE-ACETAMINOPHEN 5-325 MG PO TABS
1.0000 | ORAL_TABLET | ORAL | Status: DC | PRN
  Administered 2015-01-30: 1 via ORAL
  Filled 2015-01-30: qty 1

## 2015-01-30 MED ORDER — LIDOCAINE HCL (PF) 1 % IJ SOLN
30.0000 mL | INTRAMUSCULAR | Status: DC | PRN
  Administered 2015-01-30: 30 mL via SUBCUTANEOUS
  Filled 2015-01-30: qty 30

## 2015-01-30 MED ORDER — LACTATED RINGERS IV SOLN: 500.0000 mL | INTRAVENOUS | Status: DC | PRN

## 2015-01-30 MED ORDER — ZOLPIDEM TARTRATE 5 MG PO TABS: 5.0000 mg | ORAL_TABLET | Freq: Every evening | ORAL | Status: DC | PRN

## 2015-01-30 MED ORDER — SIMETHICONE 80 MG PO CHEW: 80.0000 mg | CHEWABLE_TABLET | ORAL | Status: DC | PRN

## 2015-01-30 MED ORDER — OXYTOCIN BOLUS FROM INFUSION
500.0000 mL | INTRAVENOUS | Status: DC
  Administered 2015-01-30: 500 mL via INTRAVENOUS

## 2015-01-30 MED ORDER — OXYCODONE-ACETAMINOPHEN 5-325 MG PO TABS
1.0000 | ORAL_TABLET | ORAL | Status: DC | PRN
  Administered 2015-01-30 – 2015-01-31 (×5): 1 via ORAL
  Filled 2015-01-30 (×5): qty 1

## 2015-01-30 MED ORDER — OXYCODONE-ACETAMINOPHEN 5-325 MG PO TABS
2.0000 | ORAL_TABLET | ORAL | Status: DC | PRN
  Administered 2015-01-31 – 2015-02-01 (×4): 2 via ORAL
  Filled 2015-01-30 (×4): qty 2

## 2015-01-30 MED ORDER — BENZOCAINE-MENTHOL 20-0.5 % EX AERO
1.0000 "application " | INHALATION_SPRAY | CUTANEOUS | Status: DC | PRN
  Administered 2015-01-30 – 2015-01-31 (×2): 1 via TOPICAL
  Filled 2015-01-30 (×2): qty 56

## 2015-01-30 MED ORDER — SODIUM CHLORIDE 0.9 % IV SOLN
2.0000 g | Freq: Once | INTRAVENOUS | Status: AC
  Administered 2015-01-30: 2 g via INTRAVENOUS
  Filled 2015-01-30: qty 2000

## 2015-01-30 MED ORDER — FENTANYL 2.5 MCG/ML BUPIVACAINE 1/10 % EPIDURAL INFUSION (WH - ANES): 14.0000 mL/h | INTRAMUSCULAR | Status: DC | PRN

## 2015-01-30 MED ORDER — SODIUM CHLORIDE 0.9 % IJ SOLN: 3.0000 mL | Freq: Two times a day (BID) | INTRAMUSCULAR | Status: DC

## 2015-01-30 MED ORDER — PRENATAL MULTIVITAMIN CH
1.0000 | ORAL_TABLET | Freq: Every day | ORAL | Status: DC
  Administered 2015-02-01: 1 via ORAL
  Filled 2015-01-30 (×2): qty 1

## 2015-01-30 MED ORDER — PENICILLIN G POTASSIUM 5000000 UNITS IJ SOLR
2.5000 10*6.[IU] | INTRAVENOUS | Status: DC
  Administered 2015-01-30: 2.5 10*6.[IU] via INTRAVENOUS
  Filled 2015-01-30 (×5): qty 2.5

## 2015-01-30 MED ORDER — TERBUTALINE SULFATE 1 MG/ML IJ SOLN
0.2500 mg | Freq: Once | INTRAMUSCULAR | Status: DC | PRN
  Filled 2015-01-30: qty 1

## 2015-01-30 MED ORDER — ONDANSETRON HCL 4 MG PO TABS: 4.0000 mg | ORAL_TABLET | ORAL | Status: DC | PRN

## 2015-01-30 MED ORDER — ONDANSETRON HCL 4 MG/2ML IJ SOLN: 4.0000 mg | Freq: Four times a day (QID) | INTRAMUSCULAR | Status: DC | PRN

## 2015-01-30 MED ORDER — LACTATED RINGERS IV SOLN: INTRAVENOUS | Status: DC

## 2015-01-30 MED ORDER — ZOLPIDEM TARTRATE 5 MG PO TABS
5.0000 mg | ORAL_TABLET | Freq: Every evening | ORAL | Status: DC | PRN
  Administered 2015-01-30: 5 mg via ORAL
  Filled 2015-01-30: qty 1

## 2015-01-30 MED ORDER — LACTATED RINGERS IV SOLN
INTRAVENOUS | Status: DC
  Administered 2015-01-30 (×2): via INTRAVENOUS

## 2015-01-30 MED ORDER — OXYTOCIN 40 UNITS IN LACTATED RINGERS INFUSION - SIMPLE MED: 62.5000 mL/h | INTRAVENOUS | Status: DC

## 2015-01-30 MED ORDER — NALBUPHINE HCL 10 MG/ML IJ SOLN
10.0000 mg | Freq: Once | INTRAMUSCULAR | Status: AC
  Administered 2015-01-30: 10 mg via INTRAMUSCULAR
  Filled 2015-01-30: qty 1

## 2015-01-30 MED ORDER — CITRIC ACID-SODIUM CITRATE 334-500 MG/5ML PO SOLN: 30.0000 mL | ORAL | Status: DC | PRN

## 2015-01-30 MED ORDER — OXYTOCIN 40 UNITS IN LACTATED RINGERS INFUSION - SIMPLE MED
1.0000 m[IU]/min | INTRAVENOUS | Status: DC
  Administered 2015-01-30: 2 m[IU]/min via INTRAVENOUS
  Filled 2015-01-30: qty 1000

## 2015-01-30 MED ORDER — FENTANYL 2.5 MCG/ML BUPIVACAINE 1/10 % EPIDURAL INFUSION (WH - ANES)
14.0000 mL/h | INTRAMUSCULAR | Status: DC | PRN
  Administered 2015-01-30 (×2): 14 mL/h via EPIDURAL
  Filled 2015-01-30: qty 125

## 2015-01-30 MED ORDER — SODIUM CHLORIDE 0.9 % IJ SOLN: 3.0000 mL | INTRAMUSCULAR | Status: DC | PRN

## 2015-01-30 MED ORDER — DIPHENHYDRAMINE HCL 50 MG/ML IJ SOLN
12.5000 mg | INTRAMUSCULAR | Status: DC | PRN
Start: 2015-01-30 — End: 2015-01-30

## 2015-01-30 MED ORDER — TETANUS-DIPHTH-ACELL PERTUSSIS 5-2.5-18.5 LF-MCG/0.5 IM SUSP: 0.5000 mL | Freq: Once | INTRAMUSCULAR | Status: DC

## 2015-01-30 MED ORDER — OXYCODONE-ACETAMINOPHEN 5-325 MG PO TABS: 2.0000 | ORAL_TABLET | ORAL | Status: DC | PRN

## 2015-01-30 MED ORDER — ACETAMINOPHEN 325 MG PO TABS: 650.0000 mg | ORAL_TABLET | ORAL | Status: DC | PRN

## 2015-01-30 MED ORDER — FENTANYL CITRATE (PF) 100 MCG/2ML IJ SOLN
100.0000 ug | INTRAMUSCULAR | Status: DC | PRN
  Administered 2015-01-30 (×2): 100 ug via INTRAVENOUS
  Filled 2015-01-30 (×3): qty 2

## 2015-01-30 MED ORDER — MISOPROSTOL 200 MCG PO TABS
50.0000 ug | ORAL_TABLET | ORAL | Status: DC | PRN
  Administered 2015-01-30 (×2): 50 ug via ORAL
  Filled 2015-01-30 (×2): qty 0.5

## 2015-01-30 MED ORDER — LANOLIN HYDROUS EX OINT: TOPICAL_OINTMENT | CUTANEOUS | Status: DC | PRN

## 2015-01-30 MED ORDER — DIBUCAINE 1 % RE OINT: 1.0000 "application " | TOPICAL_OINTMENT | RECTAL | Status: DC | PRN

## 2015-01-30 MED ORDER — PHENYLEPHRINE 40 MCG/ML (10ML) SYRINGE FOR IV PUSH (FOR BLOOD PRESSURE SUPPORT): 80.0000 ug | PREFILLED_SYRINGE | INTRAVENOUS | Status: DC | PRN

## 2015-01-30 MED ORDER — SENNOSIDES-DOCUSATE SODIUM 8.6-50 MG PO TABS
2.0000 | ORAL_TABLET | ORAL | Status: DC
  Administered 2015-01-31 (×2): 2 via ORAL
  Filled 2015-01-30 (×2): qty 2

## 2015-01-30 MED ORDER — LIDOCAINE HCL (PF) 1 % IJ SOLN
INTRAMUSCULAR | Status: DC | PRN
  Administered 2015-01-30 (×2): 4 mL via EPIDURAL

## 2015-01-30 MED ORDER — FENTANYL CITRATE (PF) 100 MCG/2ML IJ SOLN
50.0000 ug | INTRAMUSCULAR | Status: DC | PRN
  Administered 2015-01-30: 50 ug via INTRAVENOUS
  Filled 2015-01-30: qty 2

## 2015-01-30 MED ORDER — SODIUM CHLORIDE 0.9 % IV SOLN: 250.0000 mL | INTRAVENOUS | Status: DC | PRN

## 2015-01-30 MED ORDER — DIPHENHYDRAMINE HCL 50 MG/ML IJ SOLN: 12.5000 mg | INTRAMUSCULAR | Status: DC | PRN

## 2015-01-30 MED ORDER — NALBUPHINE HCL 10 MG/ML IJ SOLN
10.0000 mg | Freq: Once | INTRAMUSCULAR | Status: AC
  Administered 2015-01-30: 10 mg via INTRAVENOUS
  Filled 2015-01-30: qty 1

## 2015-01-30 MED ORDER — DIPHENHYDRAMINE HCL 25 MG PO CAPS: 25.0000 mg | ORAL_CAPSULE | Freq: Four times a day (QID) | ORAL | Status: DC | PRN

## 2015-01-30 MED ORDER — IBUPROFEN 600 MG PO TABS
600.0000 mg | ORAL_TABLET | Freq: Four times a day (QID) | ORAL | Status: DC
  Administered 2015-01-30 – 2015-02-01 (×9): 600 mg via ORAL
  Filled 2015-01-30 (×9): qty 1

## 2015-01-30 MED ORDER — WITCH HAZEL-GLYCERIN EX PADS: 1.0000 "application " | MEDICATED_PAD | CUTANEOUS | Status: DC | PRN

## 2015-01-30 NOTE — Progress Notes (Signed)
Kelly Holland is a 24 y.o. G2P0010 at 5933w1d by ultrasound admitted for induction of labor due to Hypertension.  Subjective:   Objective: BP 145/94 mmHg  Pulse 73  Temp(Src) 97.8 F (36.6 C) (Oral)  Resp 20  Ht 5' 7.32" (1.71 m)  Wt 249 lb (112.946 kg)  BMI 38.63 kg/m2  SpO2 100%  LMP 05/01/2014 (LMP Unknown)      FHT:  FHR: 145-150 bpm, variability: moderate,  accelerations:  Present,  decelerations:  Absent UC:   Mild irreg uc's SVE:   Dilation: 4.5 Effacement (%): 70 Station: -2 Exam by:: L Lamon RN  Labs: Lab Results  Component Value Date   WBC 6.9 01/30/2015   HGB 12.1 01/30/2015   HCT 35.6* 01/30/2015   MCV 86.4 01/30/2015   PLT 185 01/30/2015    Assessment / Plan: In[facility] of [facility],  progressing well on pitocin  Labor: Progressing normally Preeclampsia:  no signs or symptoms of toxicity Fetal Wellbeing:  Category I Pain Control:  Labor support without medications I/D:  n/a Anticipated MOD:  NSVD  Reham Slabaugh DARLENE 01/30/2015, 10:22 AM

## 2015-01-30 NOTE — Anesthesia Procedure Notes (Signed)
Epidural Patient location during procedure: OB Start time: 01/30/2015 10:09 AM  Staffing Anesthesiologist: Mal AmabileFOSTER, Moani Weipert  Preanesthetic Checklist Completed: patient identified, site marked, surgical consent, pre-op evaluation, timeout performed, IV checked, risks and benefits discussed and monitors and equipment checked  Epidural Patient position: sitting Prep: site prepped and draped and DuraPrep Patient monitoring: continuous pulse ox and blood pressure Approach: midline Location: L3-L4 Injection technique: LOR air  Needle:  Needle type: Tuohy  Needle gauge: 17 G Needle length: 9 cm and 9 Needle insertion depth: 8 cm Catheter type: closed end flexible Catheter size: 19 Gauge Catheter at skin depth: 13 cm Test dose: negative and Other  Assessment Events: blood not aspirated, injection not painful, no injection resistance, negative IV test and no paresthesia  Additional Notes Patient identified. Risks and benefits discussed including failed block, incomplete  Pain control, post dural puncture headache, nerve damage, paralysis, blood pressure Changes, nausea, vomiting, reactions to medications-both toxic and allergic and post Partum back pain. All questions were answered. Patient expressed understanding and wished to proceed. Sterile technique was used throughout procedure. Epidural site was Dressed with sterile barrier dressing. No paresthesias, signs of intravascular injection Or signs of intrathecal spread were encountered.  Patient was more comfortable after the epidural was dosed. Please see RN's note for documentation of vital signs and FHR which are stable.

## 2015-01-30 NOTE — Lactation Note (Signed)
This note was copied from the chart of Kelly Holland. Lactation Consultation Note Initial visit at 5 hours of age. Mom is requesting comfort gels.  Baby latched well for a feeding and then with last attempt was too sleepy.  Mom has large soft breast with loose skin and large areolas.  Right nipple has a little pink at base of nipple possibly due to shallow latch.  Hand pump given with instruction to use prior to latch to help evert short nipples and stimulate milk.  Encouraged to work on hand expression prior to latch.  Mom does not have a bra so comfort gels not given at this time and may consider shells if she gets a bra.  Baby is asleep in FOBs arms.  [facility] resources given and discussed.  Encouraged to feed with early cues on demand.  Early newborn behavior discussed.  Encouraged to wake baby for feedings and consider DEBP if baby is not latching well tonight due to baby wt. Of 5#15oz. Hand expression demonstrated  With small drop of colostrum visible.  Mom is very tired with eyes closing during visit.  Mom to call for assist as needed.    Patient Name: Kelly Renie OraLatoya Cumbo GNFAO'ZToday's Date: 01/30/2015 Reason for consult: Initial assessment   Maternal Data Has patient been taught Hand Expression?: Yes Does the patient have breastfeeding experience prior to this delivery?: No  Feeding Feeding Type: Breast Fed Length of feed: 30 min  LATCH Score/Interventions Latch: Repeated attempts needed to sustain latch, nipple held in mouth throughout feeding, stimulation needed to elicit sucking reflex. Intervention(s): Assist with latch;Adjust position  Audible Swallowing: A few with stimulation Intervention(s): Skin to skin  Type of Nipple: Everted at rest and after stimulation  Comfort (Breast/Nipple): Filling, red/small blisters or bruises, mild/mod discomfort  Problem noted: Mild/Moderate discomfort Interventions (Mild/moderate discomfort):  (repositioned)  Hold (Positioning): Assistance  needed to correctly position infant at breast and maintain latch. Intervention(s): Breastfeeding basics reviewed;Skin to skin;Position options  LATCH Score: 6  Lactation Tools Discussed/Used Pump Review: Setup, frequency, and cleaning Initiated by:: JS Date initiated:: 01/30/15   Consult Status Consult Status: Follow-up Date: 01/31/15 Follow-up type: In-patient    Diamond Martucci, Arvella MerlesJana Lynn 01/30/2015, 7:45 PM

## 2015-01-30 NOTE — Anesthesia Preprocedure Evaluation (Signed)
Anesthesia Evaluation  Patient identified by MRN, date of birth, ID band Patient awake    Reviewed: Allergy & Precautions, Patient's Chart, lab work & pertinent test results  Airway Mallampati: III  TM Distance: >3 FB     Dental no notable dental hx.    Pulmonary neg pulmonary ROS,    Pulmonary exam normal       Cardiovascular hypertension, Normal cardiovascular exam    Neuro/Psych  Headaches, Depression    GI/Hepatic Neg liver ROS, GERD-  ,  Endo/Other  Morbid obesity  Renal/GU negative Renal ROS  negative genitourinary   Musculoskeletal Back pain   Abdominal (+) + obese,   Peds  Hematology   Anesthesia Other Findings   Reproductive/Obstetrics (+) Pregnancy                             Anesthesia Physical Anesthesia Plan  ASA: III  Anesthesia Plan: Epidural   Post-op Pain Management:    Induction:   Airway Management Planned: Natural Airway  Additional Equipment:   Intra-op Plan:   Post-operative Plan:   Informed Consent: I have reviewed the patients History and Physical, chart, labs and discussed the procedure including the risks, benefits and alternatives for the proposed anesthesia with the patient or authorized representative who has indicated his/her understanding and acceptance.     Plan Discussed with: Anesthesiologist  Anesthesia Plan Comments:         Anesthesia Quick Evaluation

## 2015-01-30 NOTE — Progress Notes (Signed)
Kelly Holland is a 24 y.o. G2P0010 at 6571w1d by ultrasound admitted for induction of labor due to Hypertension.  Subjective:   Objective: BP 142/82 mmHg  Pulse 93  Temp(Src) 98.7 F (37.1 C) (Oral)  Resp 18  Ht 5' 7.32" (1.71 m)  Wt 249 lb (112.946 kg)  BMI 38.63 kg/m2  SpO2 100%  LMP 05/01/2014 (LMP Unknown)      FHT:  FHR: 145 bpm, variability: moderate,  accelerations:  Abscent,  decelerations:  Present early UC:   regular, every 3-4 minutes SVE:   Dilation: 6 Effacement (%): 80 Station: -1 Exam by:: L Lamon RN  Labs: Lab Results  Component Value Date   WBC 6.9 01/30/2015   HGB 12.1 01/30/2015   HCT 35.6* 01/30/2015   MCV 86.4 01/30/2015   PLT 185 01/30/2015    Assessment / Plan: [facility],  progressing well on pitocin  Labor: Progressing normally Preeclampsia:  no signs or symptoms of toxicity Fetal Wellbeing:  Category II Pain Control:  Epidural I/D:  n/a Anticipated MOD:  NSVD  LAWSON, MARIE DARLENE 01/30/2015, 1:29 PM

## 2015-01-30 NOTE — H&P (Signed)
Obstetric ADMISSION HISTORY AND PHYSICAL  Kelly Holland is a 24 y.o. female G1P0 with IUP at [redacted]w[redacted]d by LMP c/w 7w sono presenting for IOL 2/2 gestational HTN. She reports +FMs, No LOF, no VB, reports blurry vision, headaches or peripheral edema, and RUQ pain.  She plans on breast feeding. She request POP for birth control.  Reports hx of multiple suicide attempts, has been rx'd medications for depression however has never started as she did "not feel good about it".  Last SI attempt per patient was about 60days ago Dating: By LMP c/w 7w sono --->  Estimated Date of Delivery: 02/05/15    Prenatal History/Complications:  Past Medical History: Past Medical History  Diagnosis Date  . Headache     migraines in the past    Past Surgical History: Past Surgical History  Procedure Laterality Date  . Wisdom tooth extraction    . Laparoscopic abdominal exploration  2014    Obstetrical History: OB History    Gravida Para Term Preterm AB TAB SAB Ectopic Multiple Living   1               Social History: History   Social History  . Marital Status: Single    Spouse Name: N/A  . Number of Children: N/A  . Years of Education: N/A   Social History Main Topics  . Smoking status: Never Smoker   . Smokeless tobacco: Never Used  . Alcohol Use: No  . Drug Use: No  . Sexual Activity: Yes   Other Topics Concern  . Not on file   Social History Narrative    Family History: Family History  Problem Relation Age of Onset  . Heart disease Mother   . Hypertension Mother   . Hypertension Father   . Kidney disease Father     Allergies: No Known Allergies  Prescriptions prior to admission  Medication Sig Dispense Refill Last Dose  . acetaminophen (TYLENOL) 500 MG tablet Take 500 mg by mouth every 6 (six) hours as needed for headache.   Past Month at Unknown time  . Ca Carbonate-Mag Hydroxide (ROLAIDS PO) Take 2 tablets by mouth as needed (For heartburn.). Patient takes 2 up to five  times daily prn.   01/24/2015 at Unknown time  . cyclobenzaprine (FLEXERIL) 5 MG tablet Take 5 mg by mouth 3 (three) times daily as needed (For headache.).   Past Week at Unknown time  . Prenatal Vit-Fe Fumarate-FA (PRENATAL MULTIVITAMIN) TABS tablet Take 1 tablet by mouth daily at 12 noon.   01/25/2015 at Unknown time  . zolpidem (AMBIEN) 5 MG tablet Take 1 tablet (5 mg total) by mouth at bedtime as needed for sleep. (Patient not taking: Reported on 01/25/2015) 4 tablet 0 Not Taking at Unknown time     Review of Systems   All systems reviewed and negative except as stated in HPI  Height 5' 7.32" (1.71 m), weight 249 lb (112.946 kg), last menstrual period 05/01/2014. General appearance: alert, cooperative and appears stated age Lungs: clear to auscultation bilaterally Heart: regular rate and rhythm Abdomen: soft, non-tender; bowel sounds normal Extremities: Homans sign is negative, no sign of DVT Presentation: cephalic 1/50/-2, very firm     Prenatal labs: ABO, Rh: B/Positive/-- (12/17 0000) Antibody: Negative (12/17 0000) Rubella:   RPR: Nonreactive (12/17 0000)  HBsAg: Negative (12/17 0000)  HIV: NONREACTIVE (12/01 1847)  GBS: Positive (06/25 0000)  1 hr Glucola 96, 152/110/107 Genetic screening  Neg MSAFP, 1st screen neg Anatomy US normal  UDS: +THC  Prenatal Transfer Tool  Maternal Diabetes: No Genetic Screening: Normal Maternal Ultrasounds/Referrals: Normal Fetal Ultrasounds or other Referrals:  None Maternal Substance Abuse:  Yes:  Type: Marijuana Significant Maternal Medications:  None Significant Maternal Lab Results: Lab values include: Group B Strep positive  Results for orders placed or performed during the hospital encounter of 01/30/15 (from the past 24 hour(s))  CBC   Collection Time: 01/30/15  1:00 AM  Result Value Ref Range   WBC 6.5 4.0 - 10.5 K/uL   RBC 4.08 3.87 - 5.11 MIL/uL   Hemoglobin 11.9 (L) 12.0 - 15.0 g/dL   HCT 16.135.5 (L) 09.636.0 - 04.546.0 %   MCV 87.0  78.0 - 100.0 fL   MCH 29.2 26.0 - 34.0 pg   MCHC 33.5 30.0 - 36.0 g/dL   RDW 40.913.8 81.111.5 - 91.415.5 %   Platelets 187 150 - 400 K/uL    Patient Active Problem List   Diagnosis Date Noted  . Gestational hypertension 01/30/2015  . Evaluate anatomy not seen on prior sonogram   . [redacted] weeks gestation of pregnancy   . Encounter for fetal anatomic survey   . [redacted] weeks gestation of pregnancy   . [redacted] weeks gestation of pregnancy   . Encounter for (NT) nuchal translucency scan   . Abdominal pain affecting pregnancy, antepartum 06/22/2014    Assessment: Kelly Holland is a 24 y.o. G1P0 at 6374w1d here for IOL 2/2 gHTN with visual changes  #Labor: FB placed, PO 50mcg cytotec #Pain: Epidural upon request once FB out #FWB: Cat I #ID:  GBS+> PCN once FB out #MOF: breast #MOC:POPs #THC usage: SW consult, UDS  Kelly Holland ROCIO 01/30/2015, 1:23 AM

## 2015-01-31 NOTE — Clinical Social Work Maternal (Signed)
CLINICAL SOCIAL WORK MATERNAL/CHILD NOTE  Patient Details  Name: Renie OraLatoya Osias MRN: [id number] Date of Birth: 12/22/1990  Date:  01/31/2015  Clinical Social Worker Initiating Note:  Loleta BooksSarah Kaitelyn Jamison, LCSW Date/ Time Initiated:  01/31/15/0930     Child's Name:  Burnis KingfisherMichaiah   Legal Guardian:  Renie OraLatoya Amodio (mother) and Gena FrayDominique Sadler (father)  Need for Interpreter:  None   Date of Referral:  2015/06/02     Reason for Referral:  1) History of depression, anxiety, SI   2)History of substance use during pregnancy  Referral Source:  [facility]   Address:  [address] [place], [place] [address]  Phone number:  [phone]   Household Members:  Mother and sister  Natural Supports (not living in the home):  Extended Family, Immediate Family   Professional Supports: None   Employment:   Did not assess  Type of Work:   N/A  Education:    N/A  Architectinancial Resources:  Medicaid   Other Resources:  [employer] Stamps , [employer]   Cultural/Religious Considerations Which May Impact Care:  None reported  Strengths:  Ability to meet basic needs , Understanding of illness, Home prepared for child    Risk Factors/Current Problems:   1)Mental Health Concerns: MOB presents with history of depression, anxiety, and suicide attempts.  MOB is currently not participating in therapy or on medications, and denied for treatment at this time. MOB presents with insight and self-awareness related to her mental health needs, and demonstrated use of cognitive strategies to reduce symptoms.  MOB reported that she currently is feeling "okay", and reported last time that she felt suicidal was during the second trimester of this pregnancy.   Cognitive State:  Alert , Able to Concentrate , Goal Oriented , Insightful , Linear Thinking    Mood/Affect:  Flat , Comfortable , Calm    CSW Assessment:  CSW received request for consult due to MOB presenting with a history of depression, anxiety, suicide  attempts, and THC use during the pregnancy.  MOB was noted to be interacting and caring for the infant during the visit. She presented with a flat affect and displayed a limited range in affect. MOB's voice was quiet and soft, consistent eye contact was demonstrated.  MOB reported feeling exhausted, self-reported mood consistent with presentation.  FOB arrived in the room during the visit, but he removed himself from the room when CSW introduced self and reason for the consult.   MOB a vague and a limited historian as she never provided clarity on psychosocial stressors that may have contributed to her mood during the pregnancy.  She also never clarified onset of mental health symptoms, the symptoms that she experienced during the pregnancy, and her exact thoughts and feelings as she transitions to postpartum.  MOB's comments demonstrated cognitive techniques that she attempts to utilize to reduce depressive symptoms. It was evident that MOB has extensive history in participating in therapy as she reflected upon her goal of being mindful of the present moment, not worrying about the future since it only causes an increase in stress, to focus on the temporal nature of thoughts, feelings, and stressors, and the brain's ability to create incomplete/inaccurate thoughts and feelings.  MOB reflected upon her goals of implementing these strategies in order to help her to reduce emotional intensity since she reported that she tends to act upon her emotions.  MOB presented with insight on how to utilize grounding techniques to help her engage her rational mid instead  of her emotional mind.  MOB shared that she has noted that the presence of the infant helps her to be focused on the present moment, and she expressed hope that postpartum, the infant will assist her to "get out of my head", indicating how her own cognitions can increase stress.  MOB endorsed feeling supported at home by her mother and sister, and she shared  a belief that she can inform her family if she notes need to take a break or if her mental health symptoms are worsening. She shared belief that she has tried "everything" in regards to mental health treatment, and due to limited efficacy, does not have immediate plans to re-start any treatment.  MOB recognized hormonal role in postpartum depression and anxiety, but denied acute concern about her risk factors since she can reach out for help if needed.    Per MOB, substance use history significant for past history of marijuana.  MOB was unable to recall last use of THC, but voiced understanding of the hospital drug screen policy, and the collection of the infant's urine and meconium.  MOB expressed confidence that the infant's UDS and MDS and will be negative, and acknowledged subsequent steps that will be taken if the infant has a positive drug screen.   MOB denied current questions, concerns, or needs. CSW continued to provide support related to MOB's thoughts and feelings related to difficulties secondary to breastfeeding.  MOB expressed deep appreciation for the visit, and continued to report belief that she is coping well with the transition to postpartum and will be "okay".  MOB agreed to contact CSW if needs arise during the admission.   CSW Plan/Description:   1)Patient/Family Education: Hospital drug screen policy, perinatal mood and anxiety disorders 2) MOB declined referral information for mental health resources, but agreed to contact her medical provider if she notes onset of symptoms. 3) CSW to monitor infant's UDS and MDS, and will notify CPS if there is a positive drug screen. 4)No Further Intervention Required/No Barriers to Discharge   Tammra Pressman N, LCSW 01/31/2015, 12:48 PM  

## 2015-01-31 NOTE — Anesthesia Postprocedure Evaluation (Signed)
  Anesthesia Post-op Note  Patient: Kelly Holland  Procedure(s) Performed: * No procedures listed *  Patient Location: PACU and Mother/Baby  Anesthesia Type:Epidural  Level of Consciousness: awake, alert  and oriented  Airway and Oxygen Therapy: Patient Spontanous Breathing  Post-op Pain: none  Post-op Assessment: Post-op Vital signs reviewed, Patient's Cardiovascular Status Stable, No headache, No backache, No residual numbness and No residual motor weakness  Post-op Vital Signs: Reviewed and stable  Complications: No apparent anesthesia complications

## 2015-01-31 NOTE — Progress Notes (Signed)
Post Partum Day 1 Subjective: no complaints, up ad lib, voiding, tolerating PO and + flatus  Objective: Blood pressure 134/67, pulse 70, temperature 98 F (36.7 C), temperature source Oral, resp. rate 18, height 5' 7.32" (1.71 m), weight 249 lb (112.946 kg), last menstrual period 05/01/2014, SpO2 100 %, unknown if currently breastfeeding.  Physical Exam:  General: alert, cooperative, appears stated age and no distress Lochia: appropriate Uterine Fundus: firm Incision: healing well DVT Evaluation: No evidence of DVT seen on physical exam. Negative Homan's sign. No cords or calf tenderness. No significant calf/ankle edema.   Recent Labs  01/30/15 0927 01/30/15 1500  HGB 12.1 11.7*  HCT 35.6* 34.7*    Assessment/Plan: Plan for discharge tomorrow   LOS: 2 days   LAWSON, MARIE DARLENE 01/31/2015, 7:24 AM

## 2015-01-31 NOTE — Lactation Note (Addendum)
This note was copied from the chart of Girl Renie OraLatoya Pare. Lactation Consultation Note  Baby < 6lbs. Mother was able to hand express a few drops from right breast, glistening from left. FOB undressed baby to diaper for feeding STS and put hat on baby. Suggest mother prepump w/ hand pump to help firm nipples. Assisted mother with cross cradle, football and side lying.  Baby opens wide with all positions but takes a few minutes to get in a rhythmical pattern. Sucks and some swallows observed. Baby did best in side lying.  Observed feeding for more than 20 min in various positions. Spoke w/ parents about the option of post pumping a few times a day for help boost mother's milks supply. Parents seems unsure.  Will pass on to [employer] HomesJennifer RN. Mother's nipples are tender.  Provided her with comfort gels and taught FOB how to do chin tug and flange bottom lip.  Patient Name: Girl Renie OraLatoya Tarnowski ZOXWR'UToday's Date: 01/31/2015 Reason for consult: Follow-up assessment   Maternal Data    Feeding Feeding Type: Breast Fed  LATCH Score/Interventions Latch: Grasps breast easily, tongue down, lips flanged, rhythmical sucking. Intervention(s): Assist with latch;Adjust position;Breast massage;Breast compression  Audible Swallowing: A few with stimulation  Type of Nipple: Everted at rest and after stimulation  Comfort (Breast/Nipple): Filling, red/small blisters or bruises, mild/mod discomfort  Problem noted: Mild/Moderate discomfort Interventions (Mild/moderate discomfort): Comfort gels;Pre-pump if needed  Hold (Positioning): Assistance needed to correctly position infant at breast and maintain latch.  LATCH Score: 7  Lactation Tools Discussed/Used     Consult Status Consult Status: Follow-up Date: 02/01/15 Follow-up type: In-patient    Dahlia ByesBerkelhammer, [facility] 01/31/2015, 12:52 PM

## 2015-02-01 DIAGNOSIS — O133 Gestational [pregnancy-induced] hypertension without significant proteinuria, third trimester: Secondary | ICD-10-CM

## 2015-02-01 DIAGNOSIS — Z3A39 39 weeks gestation of pregnancy: Secondary | ICD-10-CM

## 2015-02-01 DIAGNOSIS — O99824 Streptococcus B carrier state complicating childbirth: Secondary | ICD-10-CM

## 2015-02-01 DIAGNOSIS — O9962 Diseases of the digestive system complicating childbirth: Secondary | ICD-10-CM

## 2015-02-01 DIAGNOSIS — O99324 Drug use complicating childbirth: Secondary | ICD-10-CM

## 2015-02-01 MED ORDER — IBUPROFEN 600 MG PO TABS: 600.0000 mg | ORAL_TABLET | Freq: Four times a day (QID) | ORAL | Status: AC | PRN

## 2015-02-01 NOTE — Discharge Instructions (Signed)

## 2015-02-01 NOTE — Lactation Note (Signed)
This note was copied from the chart of Kelly Renie [name]. Lactation Consultation Note  First time parents.  Baby less than 6lbs. Mother has not started post pumping yet.  4.6% weight loss in 24 hours. Mother has flat affect and is poor historian regarding feedings.  Mother states she is feeling very emotional today. Discussed with parents the importance of feeding baby every 3 hours.  Last night baby went from 105am to 745am without feeding.  Mother states she attempted and she would not wake up. Reiterated to feed baby in diaper only with hat on but mother states she does not like undressing baby for feedings. Offered once again the opportunity to post pump with DEBP and mother declined, She is worried it will hurt.  Explained to her suction settings. Placed baby STS on mother's chest since it had been over 3 hours since she has fed and left LC phone number. Suggested parents call once baby starts cueing.  Parents did not call so returned to room at 1230.  Parents were putting baby in car seat and stated baby breastfed for 5 min. Explained that was probably not sufficient feeding.  Parents state baby has been sleepy.  Explained SGA feeding behavior. Provided mother with another hand pump so she can post pump at home after feeding.  Suggest she post pump 4-5 times a day for 15 min and give baby back volume pumped. [facility].  Discussed WIC loaner but parents seems not interested. Mother seems exhausted and overwhelmed.  Encouraged parents to feed baby every 3 hours.  Suggest FOB keep track of feedings and set alarm during the night. Reviewed in detail how to monitor voids/stools and engorgement care. Discussed with parents that if mother does not want to post pump and baby will not latch, then they should supplement w/ formula. Reminded parents of outpatient services including phone number for questions.   Patient Name: Kelly Holland ZOXWR'UToday's Date:  02/01/2015 Reason for consult: Follow-up assessment   Maternal Data    Feeding Feeding Type: Breast Fed Length of feed: 15 min  LATCH Score/Interventions                      Lactation Tools Discussed/Used     Consult Status Consult Status: Follow-up Date: 02/01/15 Follow-up type: In-patient    Dahlia ByesBerkelhammer, Leo Fray [facility] 02/01/2015, 12:14 PM

## 2015-02-01 NOTE — Discharge Summary (Signed)
Obstetric Discharge Summary Reason for Admission: induction of labor  2/2 gHTN Prenatal Procedures: none Intrapartum Procedures: spontaneous vaginal delivery Postpartum Procedures: none Complications-Operative and Postpartum: 2nd degree perineal laceration  Delivery Note At 2:03 PM a viable female was delivered via Vaginal, Spontaneous Delivery (Presentation: Right Occiput Anterior).  APGAR: 8, 9; weight 5 lb 15.8 oz (2715 g).   Placenta status: Intact, Spontaneous.  Cord: 3 vessels with the following complications: None.   Anesthesia: Epidural  Episiotomy: None Lacerations: 2nd degree Suture Repair: 2.0 vicryl Est. Blood Loss (mL): 351  Mom to postpartum.  Baby to Couplet care / Skin to Skin.  Hospital Course:  Active Problems:   Gestational hypertension   Kelly Holland is a 24 y.o. G2P1011 s/p SVD.  Patient presented to L&D for IOL 2/2 gHTN.  She has postpartum course that was uncomplicated including no problems with ambulating, PO intake, urination, pain, or bleeding. The pt feels ready to go home and  will be discharged with outpatient follow-up.   Today: No acute events overnight.  Pt denies problems with ambulating, voiding or po intake.  She denies nausea or vomiting.  Pain is well controlled.  She has had flatus. She has had bowel movement.  Lochia Minimal.  Plan for birth control is  oral progesterone-only contraceptive.  Method of Feeding: breast   H/H: Lab Results  Component Value Date/Time   HGB 11.7* 01/30/2015 03:00 PM   HCT 34.7* 01/30/2015 03:00 PM    Discharge Diagnoses: Term Pregnancy-delivered  Discharge Information: Date: 02/01/2015 Activity: pelvic rest Diet: routine  Medications: PNV and Ibuprofen Breast feeding:  Yes Condition: stable Instructions: refer to handout Discharge to: home      Medication List    TAKE these medications        acetaminophen 500 MG tablet  Commonly known as:  TYLENOL  Take 500 mg by mouth every 6 (six) hours  as needed for headache.     cyclobenzaprine 5 MG tablet  Commonly known as:  FLEXERIL  Take 5 mg by mouth 3 (three) times daily as needed (For headache.).     ibuprofen 600 MG tablet  Commonly known as:  ADVIL,MOTRIN  Take 1 tablet (600 mg total) by mouth every 6 (six) hours as needed.     prenatal multivitamin Tabs tablet  Take 1 tablet by mouth daily at 12 noon.     ROLAIDS PO  Take 2 tablets by mouth as needed (For heartburn.). Patient takes 2 up to five times daily prn.     zolpidem 5 MG tablet  Commonly known as:  AMBIEN  Take 1 tablet (5 mg total) by mouth at bedtime as needed for sleep.           Follow-up Information    [facility] [place] Schedule an appointment as soon as possible for a visit in 6 weeks.   Why:  For post-partum visit   Contact information:   [address] [place] [id number] [phone]      Shirlee LatchAngela Bacigalupo ,MD MPH PGY-2 [facility]  02/01/2015,6:52 AM  Seen and examined by me also Agree with note Aviva SignsMarie L Arisbel Maione, CNM

## 2015-02-15 ENCOUNTER — Inpatient Hospital Stay (HOSPITAL_COMMUNITY)
Admission: AD | Admit: 2015-02-15 | Discharge: 2015-02-15 | Disposition: A | Discharge status: Home / Self Care | Payer: Medicaid Other | Source: Ambulatory Visit | Attending: Obstetrics and Gynecology | Admitting: Obstetrics and Gynecology

## 2015-02-15 DIAGNOSIS — Z3009 Encounter for other general counseling and advice on contraception: Secondary | ICD-10-CM

## 2015-02-15 DIAGNOSIS — Z30011 Encounter for initial prescription of contraceptive pills: Secondary | ICD-10-CM | POA: Insufficient documentation

## 2015-02-15 DIAGNOSIS — R300 Dysuria: Secondary | ICD-10-CM | POA: Diagnosis present

## 2015-02-15 DIAGNOSIS — N760 Acute vaginitis: Secondary | ICD-10-CM | POA: Insufficient documentation

## 2015-02-15 LAB — URINALYSIS, ROUTINE W REFLEX MICROSCOPIC
Bilirubin Urine: NEGATIVE
Glucose, UA: NEGATIVE mg/dL
Ketones, ur: NEGATIVE mg/dL
Nitrite: NEGATIVE
PH: 5.5 (ref 5.0–8.0)
Protein, ur: NEGATIVE mg/dL
Specific Gravity, Urine: >1.03 — ABNORMAL HIGH (ref 1.005–1.030)
Urobilinogen, UA: 0.2 mg/dL (ref 0.0–1.0)

## 2015-02-15 LAB — URINE MICROSCOPIC-ADD ON

## 2015-02-15 LAB — WET PREP, GENITAL
CLUE CELLS WET PREP: NONE SEEN
TRICH WET PREP: NONE SEEN
YEAST WET PREP: NONE SEEN

## 2015-02-15 MED ORDER — NORETHINDRONE 0.35 MG PO TABS: 1.0000 | ORAL_TABLET | Freq: Every day | ORAL | Status: AC

## 2015-02-15 NOTE — Discharge Instructions (Signed)
Monilial Vaginitis Vaginitis in a soreness, swelling and redness (inflammation) of the vagina and vulva. Monilial vaginitis is not a sexually transmitted infection. CAUSES  Yeast vaginitis is caused by yeast (candida) that is normally found in your vagina. With a yeast infection, the candida has overgrown in number to a point that upsets the chemical balance. SYMPTOMS   White, thick vaginal discharge.  Swelling, itching, redness and irritation of the vagina and possibly the lips of the vagina (vulva).  Burning or painful urination.  Painful intercourse. DIAGNOSIS  Things that may contribute to monilial vaginitis are:  Postmenopausal and virginal states.  Pregnancy.  Infections.  Being tired, sick or stressed, especially if you had monilial vaginitis in the past.  Diabetes. Good control will help lower the chance.  Birth control pills.  Tight fitting garments.  Using bubble bath, feminine sprays, douches or deodorant tampons.  Taking certain medications that kill germs (antibiotics).  Sporadic recurrence can occur if you become ill. TREATMENT  Your caregiver will give you medication.  There are several kinds of anti monilial vaginal creams and suppositories specific for monilial vaginitis. For recurrent yeast infections, use a suppository or cream in the vagina 2 times a week, or as directed.  Anti-monilial or steroid cream for the itching or irritation of the vulva may also be used. Get your caregiver's permission.  Painting the vagina with methylene blue solution may help if the monilial cream does not work.  Eating yogurt may help prevent monilial vaginitis. HOME CARE INSTRUCTIONS   Finish all medication as prescribed.  Do not have sex until treatment is completed or after your caregiver tells you it is okay.  Take warm sitz baths.  Do not douche.  Do not use tampons, especially scented ones.  Wear cotton underwear.  Avoid tight pants and panty  hose.  Tell your sexual partner that you have a yeast infection. They should go to their caregiver if they have symptoms such as mild rash or itching.  Your sexual partner should be treated as well if your infection is difficult to eliminate.  Practice safer sex. Use condoms.  Some vaginal medications cause latex condoms to fail. Vaginal medications that harm condoms are:  Cleocin cream.  Butoconazole (Femstat).  Terconazole (Terazol) vaginal suppository.  Miconazole (Monistat) (may be purchased over the counter). SEEK MEDICAL CARE IF:   You have a temperature by mouth above 102 F (38.9 C).  The infection is getting worse after 2 days of treatment.  The infection is not getting better after 3 days of treatment.  You develop blisters in or around your vagina.  You develop vaginal bleeding, and it is not your menstrual period.  You have pain when you urinate.  You develop intestinal problems.  You have pain with sexual intercourse. Document Released: 04/18/2005 Document Revised: 10/01/2011 Document Reviewed: 12/31/2008 [facility] Patient Information 2015 [place], [place]. This information is not intended to replace advice given to you by your health care provider. Make sure you discuss any questions you have with your health care provider. Postpartum Care After Vaginal Delivery After you deliver your newborn (postpartum period), the usual stay in the hospital is 24-72 hours. If there were problems with your labor or delivery, or if you have other medical problems, you might be in the hospital longer.  While you are in the hospital, you will receive help and instructions on how to care for yourself and your newborn during the postpartum period.  While you are in the hospital:  Be  sure to tell your nurses if you have pain or discomfort, as well as where you feel the pain and what makes the pain worse.  If you had an incision made near your vagina (episiotomy) or if you had  some tearing during delivery, the nurses may put ice packs on your episiotomy or tear. The ice packs may help to reduce the pain and swelling.  If you are breastfeeding, you may feel uncomfortable contractions of your uterus for a couple of weeks. This is normal. The contractions help your uterus get back to normal size.  It is normal to have some bleeding after delivery.  For the first 1-3 days after delivery, the flow is red and the amount may be similar to a period.  It is common for the flow to start and stop.  In the first few days, you may pass some small clots. Let your nurses know if you begin to pass large clots or your flow increases.  Do not  flush blood clots down the toilet before having the nurse look at them.  During the next 3-10 days after delivery, your flow should become more watery and pink or brown-tinged in color.  Ten to fourteen days after delivery, your flow should be a small amount of yellowish-white discharge.  The amount of your flow will decrease over the first few weeks after delivery. Your flow may stop in 6-8 weeks. Most women have had their flow stop by 12 weeks after delivery.  You should change your sanitary pads frequently.  Wash your hands thoroughly with soap and water for at least 20 seconds after changing pads, using the toilet, or before holding or feeding your newborn.  You should feel like you need to empty your bladder within the first 6-8 hours after delivery.  In case you become weak, lightheaded, or faint, call your nurse before you get out of bed for the first time and before you take a shower for the first time.  Within the first few days after delivery, your breasts may begin to feel tender and full. This is called engorgement. Breast tenderness usually goes away within 48-72 hours after engorgement occurs. You may also notice milk leaking from your breasts. If you are not breastfeeding, do not stimulate your breasts. Breast stimulation  can make your breasts produce more milk.  Spending as much time as possible with your newborn is very important. During this time, you and your newborn can feel close and get to know each other. Having your newborn stay in your room (rooming in) will help to strengthen the bond with your newborn. It will give you time to get to know your newborn and become comfortable caring for your newborn.  Your hormones change after delivery. Sometimes the hormone changes can temporarily cause you to feel sad or tearful. These feelings should not last more than a few days. If these feelings last longer than that, you should talk to your caregiver.  If desired, talk to your caregiver about methods of family planning or contraception.  Talk to your caregiver about immunizations. Your caregiver may want you to have the following immunizations before leaving the hospital:  Tetanus, diphtheria, and pertussis (Tdap) or tetanus and diphtheria (Td) immunization. It is very important that you and your family (including grandparents) or others caring for your newborn are up-to-date with the Tdap or Td immunizations. The Tdap or Td immunization can help protect your newborn from getting ill.  Rubella immunization.  Varicella (chickenpox) immunization.  Influenza immunization. You should receive this annual immunization if you did not receive the immunization during your pregnancy. Document Released: 05/06/2007 Document Revised: 04/02/2012 Document Reviewed: 03/05/2012 [facility] Patient Information 2015 [place], [place]. This information is not intended to replace advice given to you by your health care provider. Make sure you discuss any questions you have with your health care provider.

## 2015-02-15 NOTE — MAU Provider Note (Signed)
Chief Complaint: No chief complaint on file.   First Provider Initiated Contact with Patient 02/15/15 1818      SUBJECTIVE HPI: Kelly Holland is a 24 y.o. G2P1011 on Day 16 post NSVD who presents to maternity admissions reporting vaginal itching, burning with urination, and concern about stitches from her perineal repair.  She reports itching around her urethra and burning with urination x 2 days with pain in perineum that was mild since delivery increasing in last 2 days, especially with activity.  She feels sharp pulling pain in perineum that is intermittent.  She also reports hemorrhoids starting after her delivery and causing some pain with bowel movements.  She has hx of frequent yeast infections during pregnancy.  She has not tried any medications for itching or pain.  She also desires oral contraceptives. She was undecided when discharged from the hospital.  She is currently breastfeeding.  She denies h/a, dizziness, n/v, or fever/chills.     HPI  Past Medical History  Diagnosis Date  . Headache     migraines in the past  . Depression   . H/O suicide attempt    Past Surgical History  Procedure Laterality Date  . Wisdom tooth extraction    . Laparoscopic abdominal exploration  2014   History   Social History  . Marital Status: Single    Spouse Name: N/A  . Number of Children: N/A  . Years of Education: N/A   Occupational History  . Not on file.   Social History Main Topics  . Smoking status: Never Smoker   . Smokeless tobacco: Never Used  . Alcohol Use: No  . Drug Use: No  . Sexual Activity: Yes   Other Topics Concern  . Not on file   Social History Narrative   No current facility-administered medications on file prior to encounter.   Current Outpatient Prescriptions on File Prior to Encounter  Medication Sig Dispense Refill  . acetaminophen (TYLENOL) 500 MG tablet Take 500 mg by mouth every 6 (six) hours as needed for headache.    . ibuprofen (ADVIL,MOTRIN)  600 MG tablet Take 1 tablet (600 mg total) by mouth every 6 (six) hours as needed. 30 tablet 0  . Prenatal Vit-Fe Fumarate-FA (PRENATAL MULTIVITAMIN) TABS tablet Take 1 tablet by mouth daily at 12 noon.    . cyclobenzaprine (FLEXERIL) 5 MG tablet Take 5 mg by mouth 3 (three) times daily as needed (For headache.).    Marland Kitchen zolpidem (AMBIEN) 5 MG tablet Take 1 tablet (5 mg total) by mouth at bedtime as needed for sleep. (Patient not taking: Reported on 01/25/2015) 4 tablet 0   No Known Allergies  Review of Systems  Constitutional: Negative for fever, chills and malaise/fatigue.  Eyes: Negative for blurred vision.  Respiratory: Negative for cough and shortness of breath.   Cardiovascular: Negative for chest pain.  Gastrointestinal: Negative for heartburn, nausea and vomiting.  Genitourinary: Negative for dysuria, urgency and frequency.  Musculoskeletal: Negative.   Neurological: Negative for dizziness and headaches.  Psychiatric/Behavioral: Negative for depression.    OBJECTIVE Blood pressure 129/81, pulse 65, temperature 97.8 F (36.6 C), temperature source Oral, resp. rate 16, currently breastfeeding. GENERAL: Well-developed, well-nourished female in no acute distress.  EYES: normal sclera/conjunctiva; no lid-lag HENT: Atraumatic, normocephalic HEART: normal rate RESP: normal effort ABDOMEN: Soft, non-tender MUSCULOSKELETAL: Normal ROM EXTREMITIES: Nontender, no edema NEURO/PSYCH: Alert and oriented, appropriate affect GU: Visual inspection reveals mild erythema of labia minora around urethra, normal light lochia/discharge noted, and perineal tear  with good approximation, no erythema, or edema.  Wet prep collected with blind swab.  Small external hemorrhoids noted, flesh colored, no s/sx of incarceration   LAB RESULTS Results for orders placed or performed during the hospital encounter of 02/15/15 (from the past 24 hour(s))  Urinalysis, Routine w reflex microscopic (not at [facility])      Status: Abnormal   Collection Time: 02/15/15  3:20 PM  Result Value Ref Range   Color, Urine YELLOW YELLOW   APPearance CLEAR CLEAR   Specific Gravity, Urine >1.030 (H) 1.005 - 1.030   pH 5.5 5.0 - 8.0   Glucose, UA NEGATIVE NEGATIVE mg/dL   Hgb urine dipstick SMALL (A) NEGATIVE   Bilirubin Urine NEGATIVE NEGATIVE   Ketones, ur NEGATIVE NEGATIVE mg/dL   Protein, ur NEGATIVE NEGATIVE mg/dL   Urobilinogen, UA 0.2 0.0 - 1.0 mg/dL   Nitrite NEGATIVE NEGATIVE   Leukocytes, UA TRACE (A) NEGATIVE  Urine microscopic-add on     Status: Abnormal   Collection Time: 02/15/15  3:20 PM  Result Value Ref Range   Squamous Epithelial / LPF FEW (A) RARE   WBC, UA 0-2 <3 WBC/hpf   RBC / HPF 0-2 <3 RBC/hpf  Wet prep, genital     Status: Abnormal   Collection Time: 02/15/15  7:00 PM  Result Value Ref Range   Yeast Wet Prep HPF POC NONE SEEN NONE SEEN   Trich, Wet Prep NONE SEEN NONE SEEN   Clue Cells Wet Prep HPF POC NONE SEEN NONE SEEN   WBC, Wet Prep HPF POC RARE (A) NONE SEEN    B POS  IMAGING No results found.  ASSESSMENT 1. Vaginitis   2. Counseling for birth control, oral contraceptives     PLAN Discharge home Urine sent for culture Pt to use Terazol 7 she has at home for 1 week of treatment for possible yeast Warm bath/warm compresses to aid healing of perineum OTC medications for hemorrhoids and high fiber diet/increased fluids to prevent constipation Micronor Rx for contraception F/U at health dept for PP visit as scheduled Return to MAU as needed for emergencies    Sharen Counter Certified Nurse-Midwife 02/15/2015  7:35 PM

## 2015-02-15 NOTE — MAU Note (Signed)
Vag del on 7/10. Wants to have someone look at her stitches because she has had some sharp pain.  Some burning when she urinates. Having some itching around vagina and where urine comes out.  Bleeding had stopped. Then twice has had a gush of blood, once last wk and then today. (happened when she was getting out of the shower).  Thinks she might have hemorrhoids also.

## 2015-02-17 LAB — URINE CULTURE

## 2015-07-15 IMAGING — US US OB COMP LESS 14 WK
1 series · 14 of 28 positions shown · non-contrast
Comparison: None.

CLINICAL DATA: Pregnant patient with left pelvic pressure and pain.

EXAM:
OBSTETRIC <14 WK US AND TRANSVAGINAL OB US
TECHNIQUE: Both transabdominal and transvaginal ultrasound examinations were
performed for complete evaluation of the gestation as well as the
maternal uterus, adnexal regions, and pelvic cul-de-sac.
Transvaginal technique was performed to assess early pregnancy.

[Series 1: us ob comp less 14 wks · 14 of 56 slices shown]
[im 3/56]
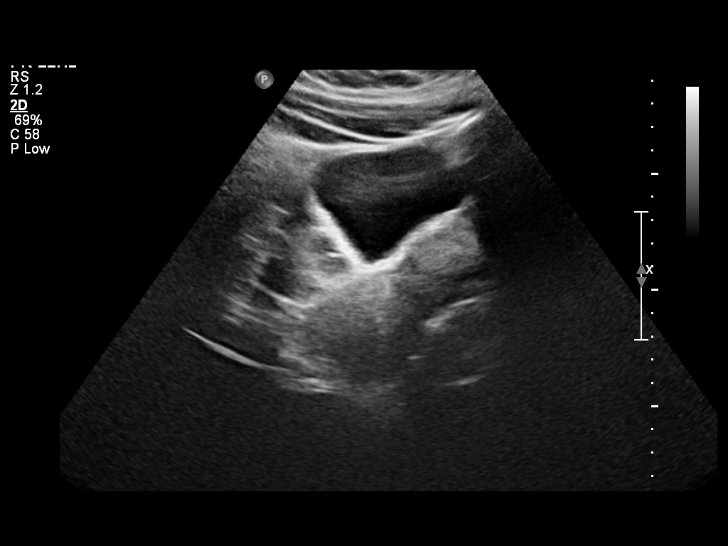
[im 7/56]
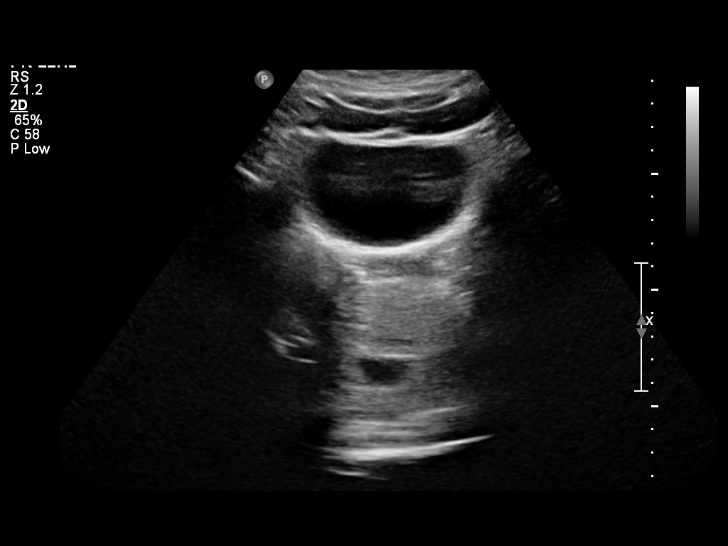
[im 11/56]
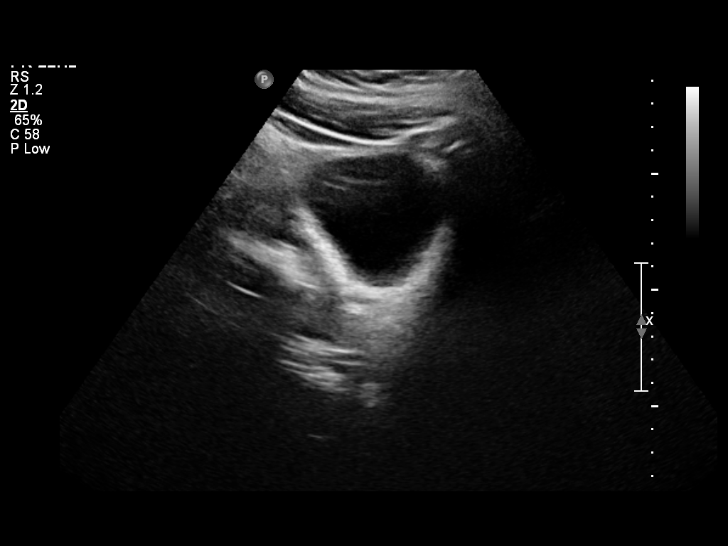
[im 15/56]
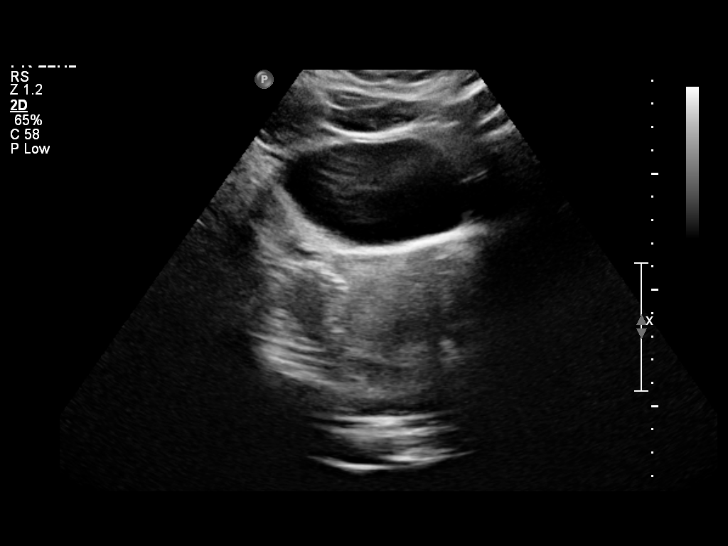
[im 19/56]
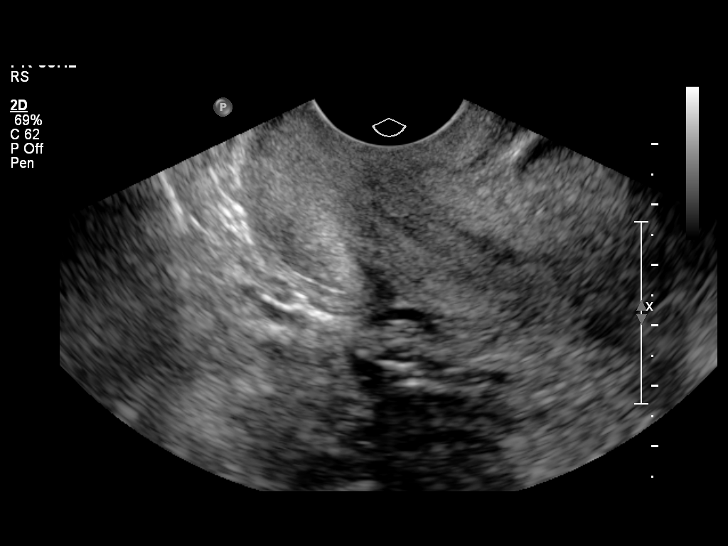
[im 23/56]
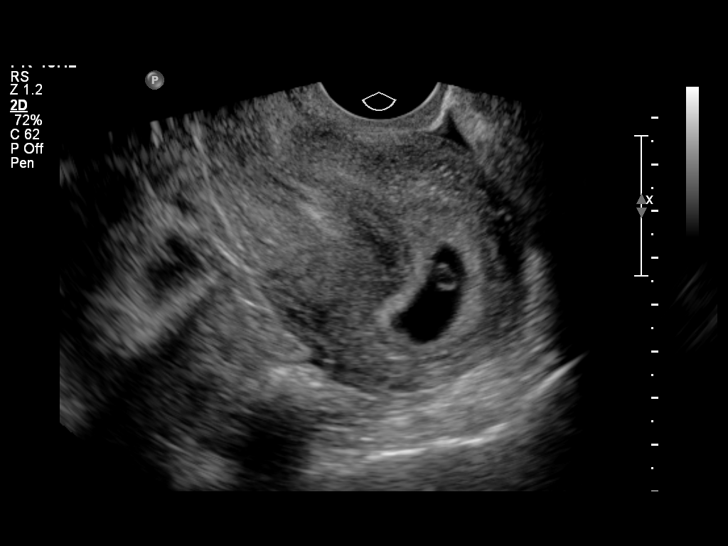
[im 27/56]
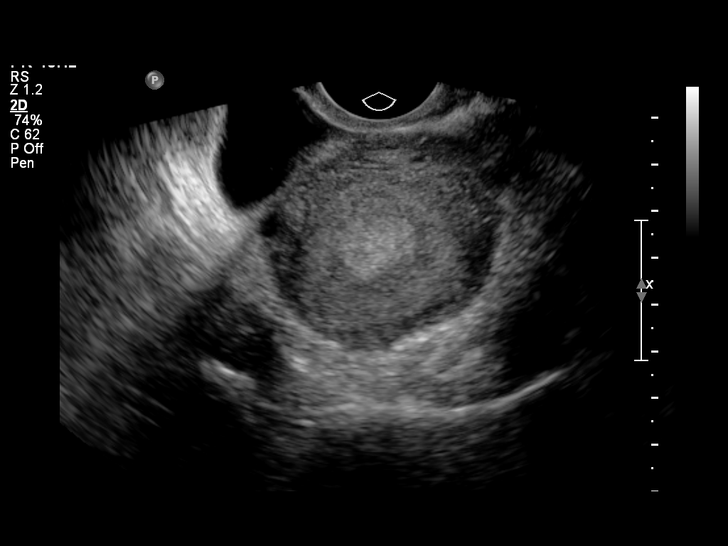
[im 31/56]
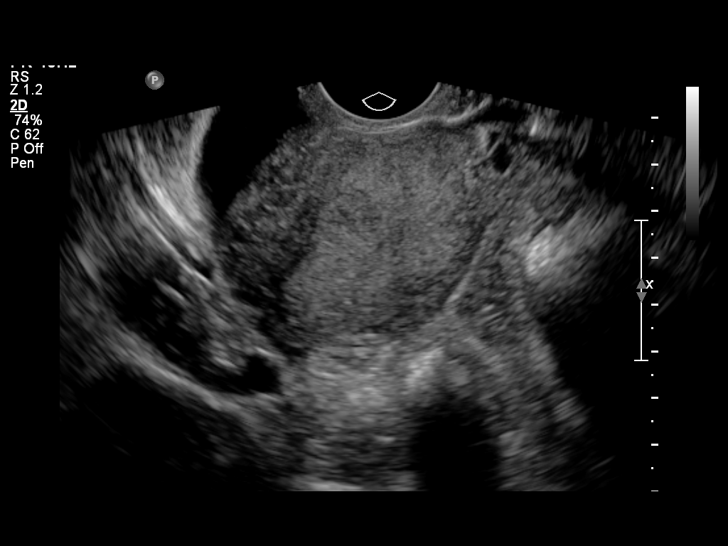
[im 35/56]
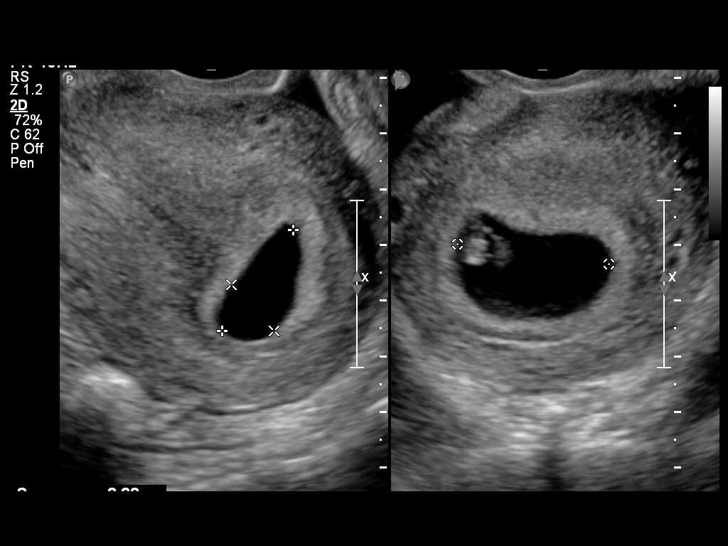
[im 39/56]
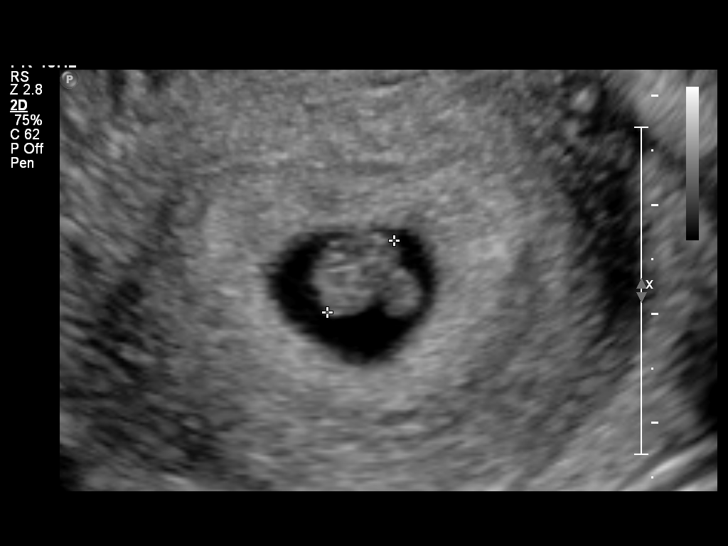
[im 43/56]
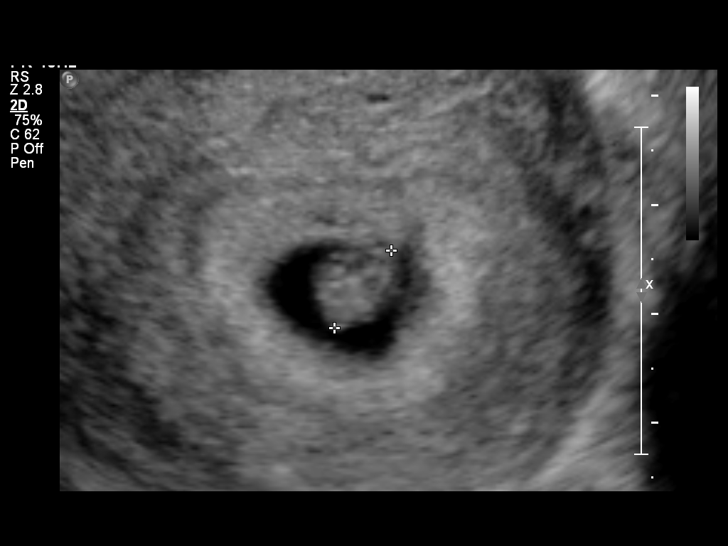
[im 47/56]
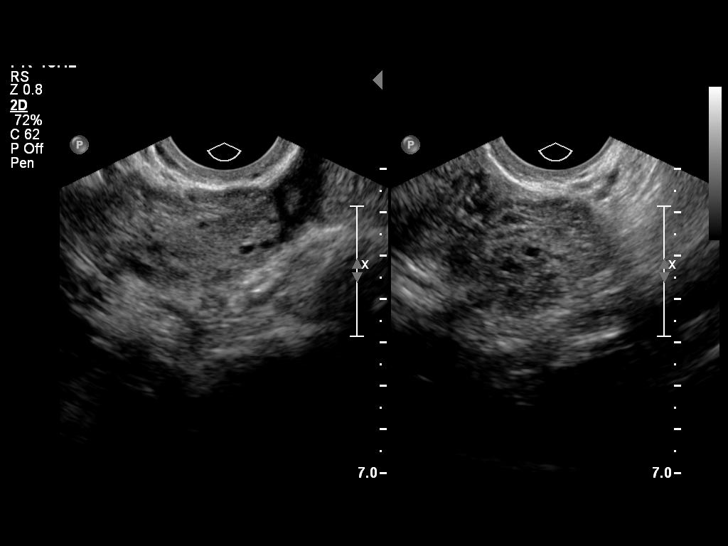
[im 51/56]
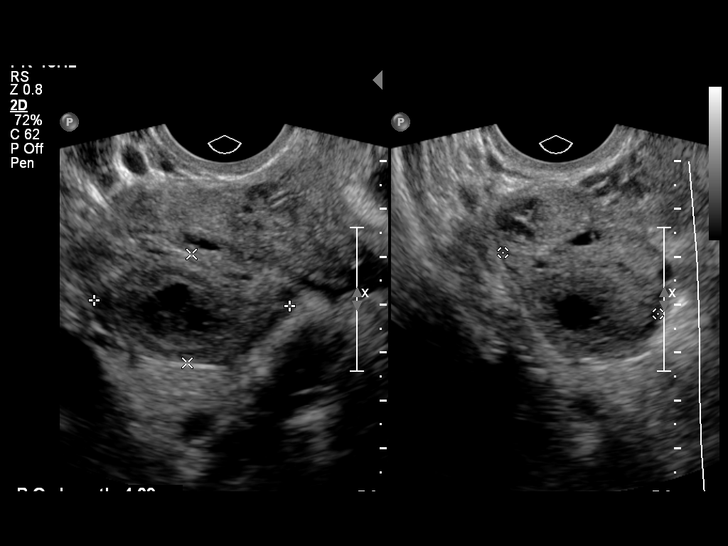
[im 56/56]
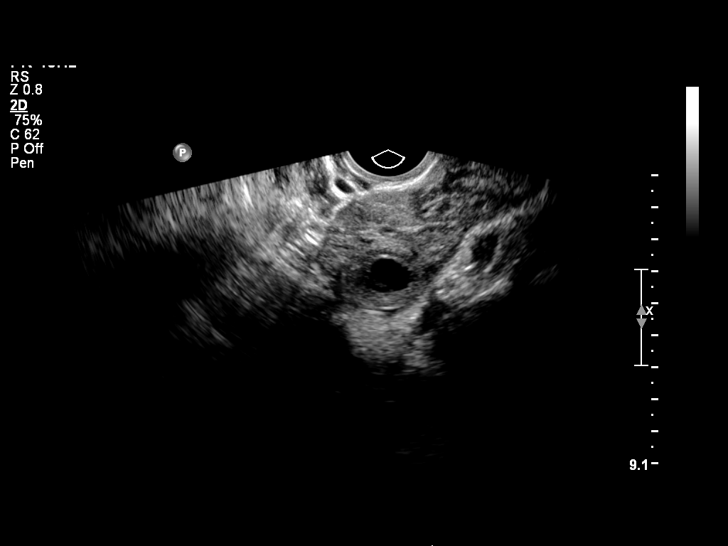

[14 of 28 positions shown; findings below may reference images not displayed]

FINDINGS: Intrauterine gestational sac: Visualized/normal in shape.

Yolk sac:  Visualized.

Embryo:  Visualized.

Cardiac Activity: Detected.

Heart Rate:  129 bpm

CRL:   0.89  mm   7 w 0 d                  US EDC: 02/08/2015.

Maternal uterus/adnexae: Likely corpus luteum cyst on the right
noted. A small amount of free pelvic fluid is seen.
IMPRESSION: Single living intrauterine pregnancy without complicating feature.
No acute abnormality.

## 2015-08-20 IMAGING — US US MFM FETAL NUCHAL TRANSLUCENCY
1 series · 13 of 27 positions shown · non-contrast
Comparison: none

[Series 1: us mfm fetal nuchal translucency · 0.11mm/px · 13 of 27 slices shown]
[im 2/27]
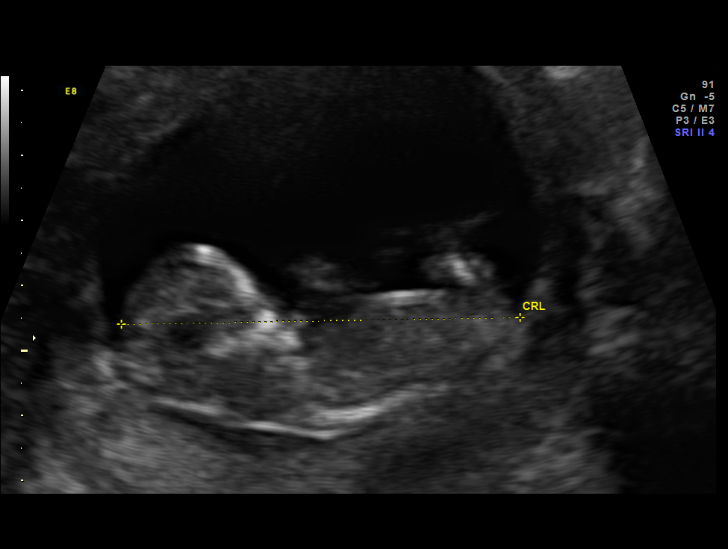
[im 4/27]
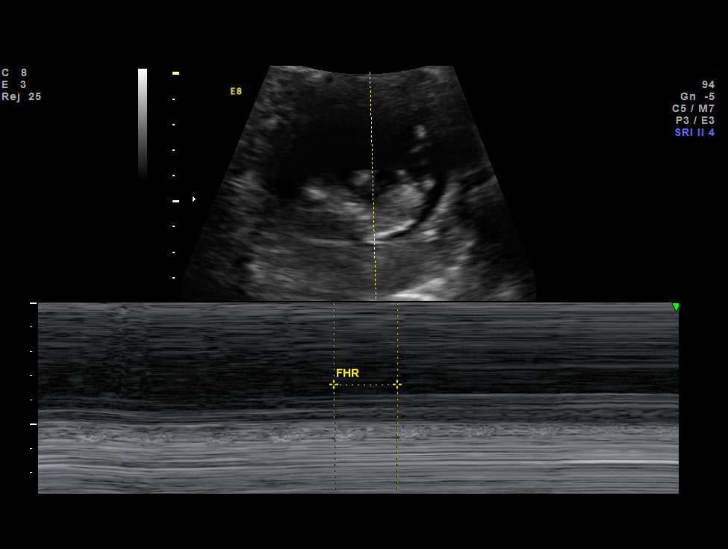
[im 6/27]
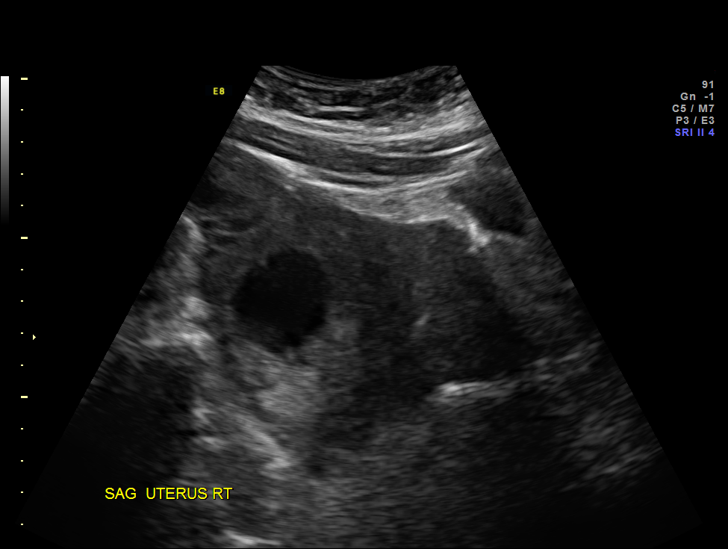
[im 8/27]
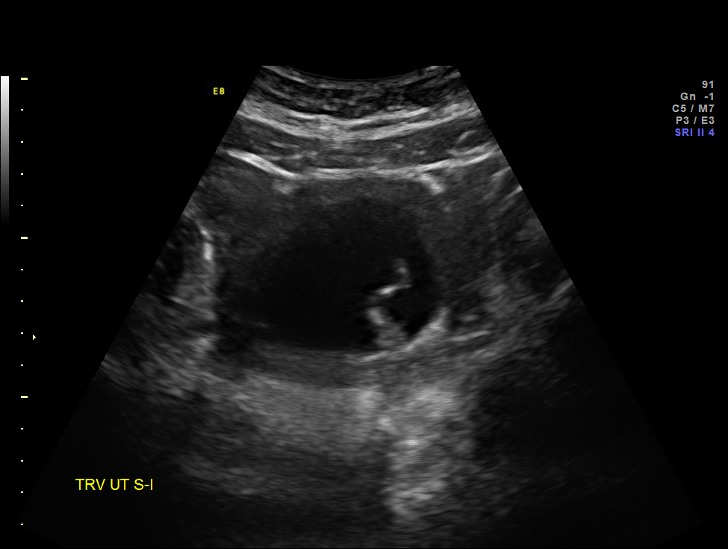
[im 10/27]
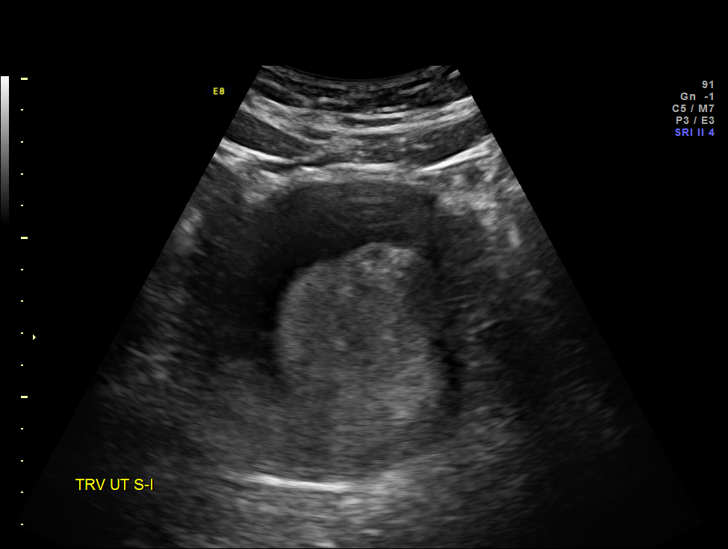
[im 12/27]
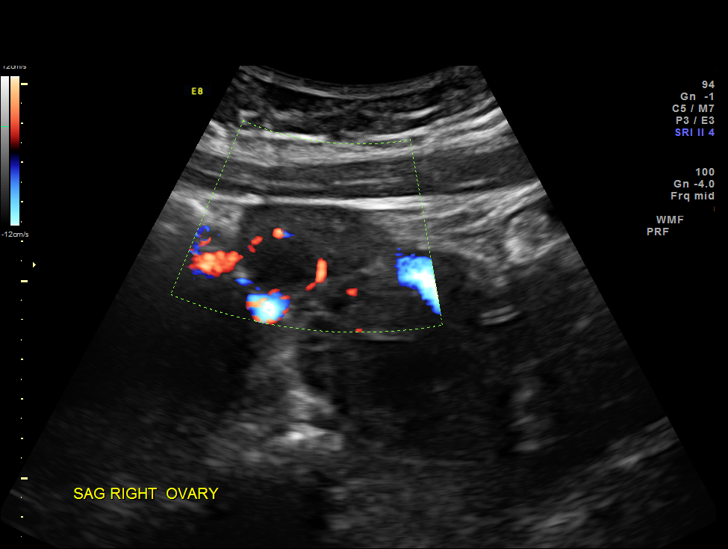
[im 14/27]
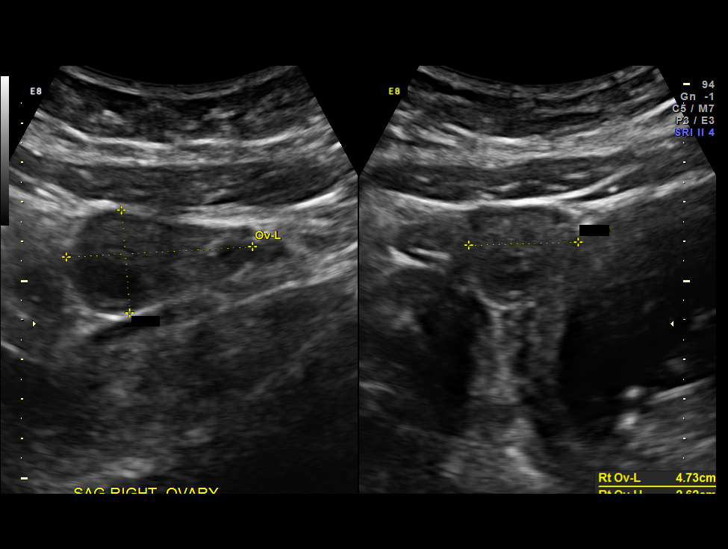
[im 16/27]
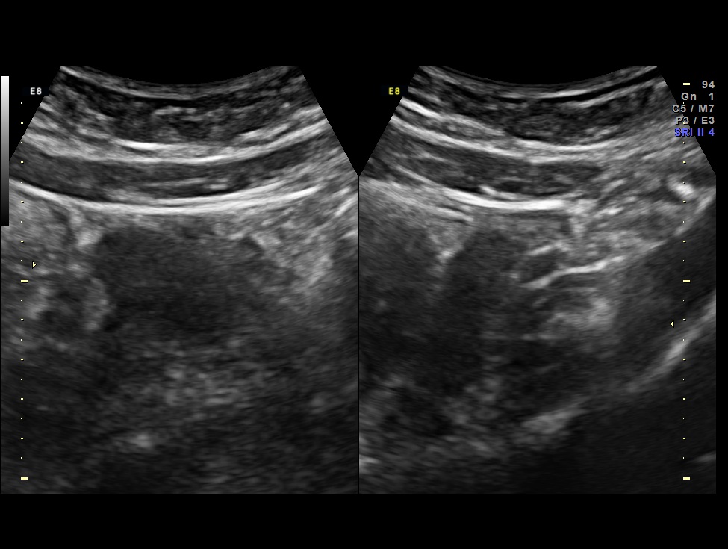
[im 18/27]
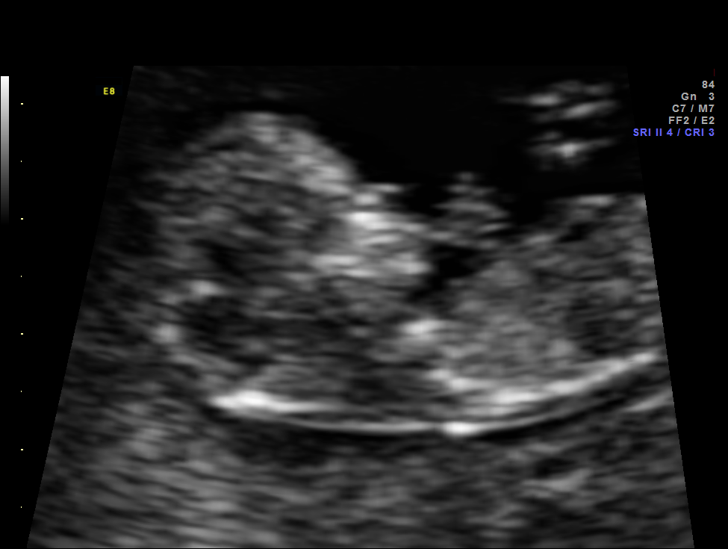
[im 20/27]
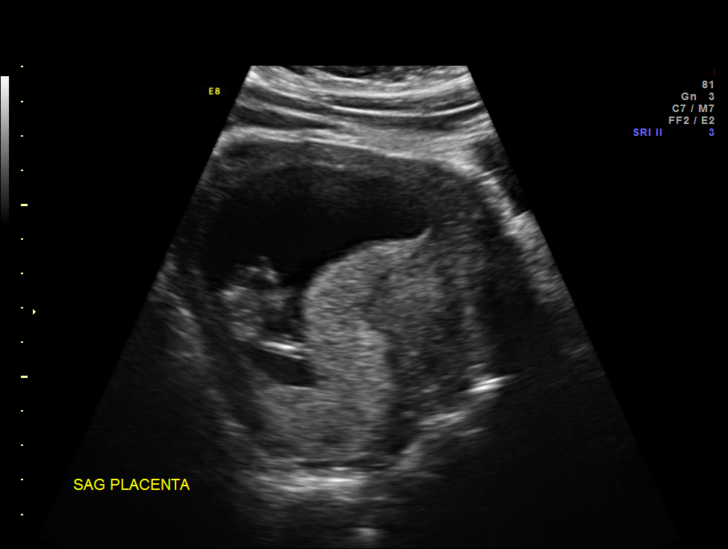
[im 22/27]
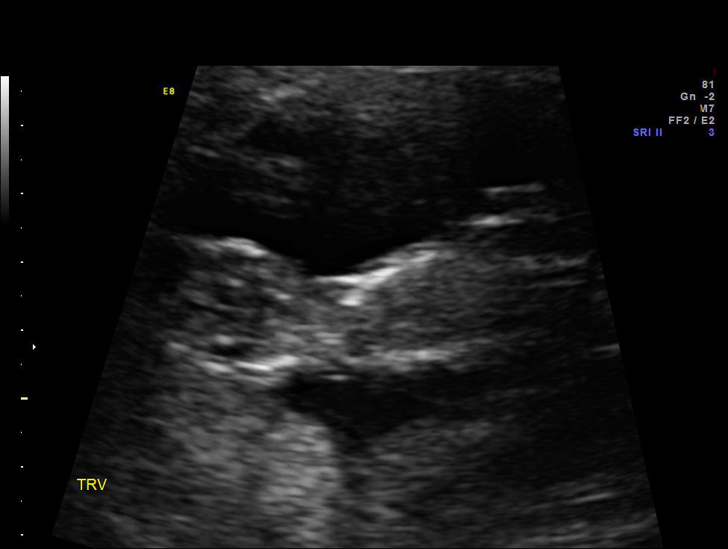
[im 24/27]
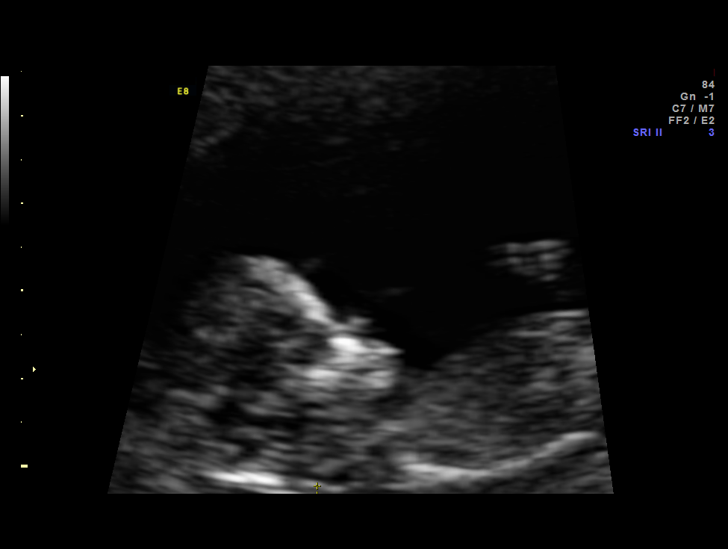
[im 26/27]
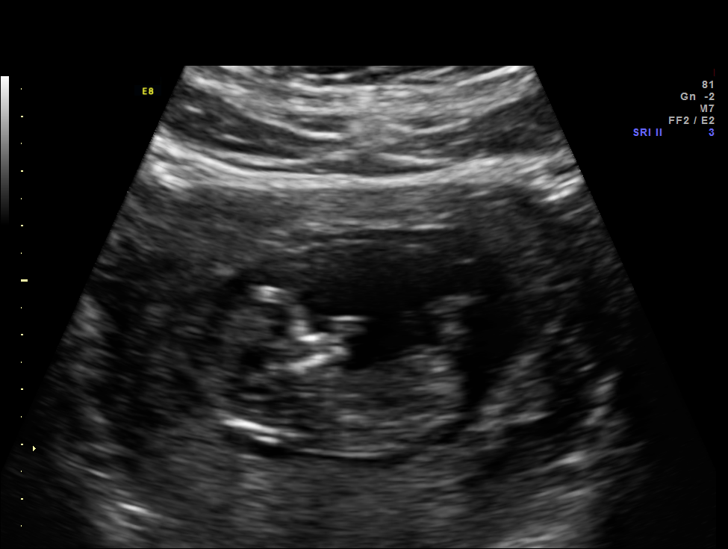

[13 of 27 positions shown; findings below may reference images not displayed]

OBSTETRICS REPORT
                      (Signed Final 07/28/2014 [DATE])

Service(s) Provided

Indications

 First trimester aneuploidy screen (NT)                Z36
Fetal Evaluation

 Num Of Fetuses:    1
 Fetal Heart Rate:  159                          bpm
 Cardiac Activity:  Observed
 Presentation:      Transverse, head to
                    maternal right
 Placenta:          Posterior

 Amniotic Fluid
 AFI FV:      Subjectively within normal limits
Biometry

 CRL:     59.8  mm     G. Age:  12w 2d                 EDD:    02/07/15

 NT:       1.1  mm
Gestational Age

 LMP:           12w 4d        Date:  05/01/14                 EDD:   02/05/15
 Best:          12w 4d     Det. By:  LMP  (05/01/14)          EDD:   02/05/15
Cervix Uterus Adnexa

 Cervix:       Normal appearance by transabdominal scan.

 Left Ovary:    Size(cm) L: 4.1 x W: 1.89 x H: 2.44  Volume(cc):
 Right Ovary:   Size(cm) L: 4.73 x W: 2.78 x H: 2.62  Volume(cc): 18
 Adnexa:     No abnormality visualized.
Impression

 Single IUP at 12w 4d
 Normal NT (1.1 mm).  Nasal bone seen.
 First trimester aneuploidy screen performed as noted above.
Recommendations

 Please do not draw triple/quad screen, though patient should
 be offered MSAFP for neural tube defect screening.
 Recommend ultrasound for fetal anatomy at 18-20 weeks

 questions or concerns.
